# Patient Record
Sex: Male | Born: 1945
Health system: Southern US, Community
[De-identification: ages and names within clinical notes are randomized; demographics above are authoritative.]

## PROBLEM LIST (undated history)

## (undated) DIAGNOSIS — I2510 Atherosclerotic heart disease of native coronary artery without angina pectoris: Secondary | ICD-10-CM

## (undated) DIAGNOSIS — IMO0001 Reserved for inherently not codable concepts without codable children: Secondary | ICD-10-CM

## (undated) DIAGNOSIS — D649 Anemia, unspecified: Secondary | ICD-10-CM

## (undated) DIAGNOSIS — E7800 Pure hypercholesterolemia, unspecified: Secondary | ICD-10-CM

## (undated) DIAGNOSIS — K611 Rectal abscess: Secondary | ICD-10-CM

## (undated) HISTORY — PX: COLONOSCOPY: SHX174

## (undated) HISTORY — PX: CARDIAC VALVE REPLACEMENT: SHX585

## (undated) HISTORY — DX: Atherosclerotic heart disease of native coronary artery without angina pectoris: I25.10

## (undated) HISTORY — PX: NO PAST SURGERIES: SHX2092

---

## 2004-03-22 ENCOUNTER — Ambulatory Visit (HOSPITAL_COMMUNITY): Admission: RE | Admit: 2004-03-22 | Discharge: 2004-03-22 | Payer: Self-pay | Admitting: Internal Medicine

## 2011-04-18 ENCOUNTER — Telehealth (INDEPENDENT_AMBULATORY_CARE_PROVIDER_SITE_OTHER): Payer: Self-pay | Admitting: General Surgery

## 2011-04-18 NOTE — Telephone Encounter (Signed)
The patient called questioning if he could be seen today in urgent clinic for a perirectal abscess, urgent clinic is full for today, pt stated he could wait to until tomorrow afternoon to be seen. Area is painful and hard to sit.

## 2011-04-19 ENCOUNTER — Encounter (INDEPENDENT_AMBULATORY_CARE_PROVIDER_SITE_OTHER): Payer: Self-pay | Admitting: Surgery

## 2011-04-20 ENCOUNTER — Encounter (INDEPENDENT_AMBULATORY_CARE_PROVIDER_SITE_OTHER): Payer: Self-pay | Admitting: Surgery

## 2011-06-12 ENCOUNTER — Encounter (HOSPITAL_COMMUNITY): Payer: Self-pay | Admitting: *Deleted

## 2011-06-14 ENCOUNTER — Encounter (HOSPITAL_COMMUNITY): Payer: Self-pay | Admitting: *Deleted

## 2011-06-15 ENCOUNTER — Encounter (HOSPITAL_COMMUNITY): Payer: Self-pay | Admitting: *Deleted

## 2011-06-15 ENCOUNTER — Ambulatory Visit (HOSPITAL_COMMUNITY)
Admission: RE | Admit: 2011-06-15 | Discharge: 2011-06-15 | Disposition: A | Discharge status: Home / Self Care | Payer: Medicare Other | Source: Ambulatory Visit | Attending: Gastroenterology | Admitting: Gastroenterology

## 2011-06-15 ENCOUNTER — Ambulatory Visit (HOSPITAL_COMMUNITY): Payer: Medicare Other | Admitting: *Deleted

## 2011-06-15 ENCOUNTER — Encounter (HOSPITAL_COMMUNITY)
Admission: RE | Disposition: A | Discharge status: Home / Self Care | Payer: Self-pay | Source: Ambulatory Visit | Attending: Gastroenterology

## 2011-06-15 DIAGNOSIS — Z800 Family history of malignant neoplasm of digestive organs: Secondary | ICD-10-CM | POA: Insufficient documentation

## 2011-06-15 DIAGNOSIS — D371 Neoplasm of uncertain behavior of stomach: Secondary | ICD-10-CM | POA: Insufficient documentation

## 2011-06-15 DIAGNOSIS — Z1211 Encounter for screening for malignant neoplasm of colon: Secondary | ICD-10-CM | POA: Insufficient documentation

## 2011-06-15 DIAGNOSIS — Z79899 Other long term (current) drug therapy: Secondary | ICD-10-CM | POA: Insufficient documentation

## 2011-06-15 DIAGNOSIS — E7800 Pure hypercholesterolemia, unspecified: Secondary | ICD-10-CM | POA: Insufficient documentation

## 2011-06-15 HISTORY — DX: Rectal abscess: K61.1

## 2011-06-15 HISTORY — DX: Reserved for inherently not codable concepts without codable children: IMO0001

## 2011-06-15 HISTORY — DX: Pure hypercholesterolemia, unspecified: E78.00

## 2011-06-15 SURGERY — COLONOSCOPY WITH PROPOFOL
Anesthesia: Monitor Anesthesia Care

## 2011-06-15 MED ORDER — FENTANYL CITRATE 0.05 MG/ML IJ SOLN
INTRAMUSCULAR | Status: DC | PRN
  Administered 2011-06-15 (×3): 50 ug via INTRAVENOUS

## 2011-06-15 MED ORDER — MIDAZOLAM HCL 5 MG/5ML IJ SOLN
INTRAMUSCULAR | Status: DC | PRN
  Administered 2011-06-15: 1 mg via INTRAVENOUS

## 2011-06-15 MED ORDER — LACTATED RINGERS IV SOLN
INTRAVENOUS | Status: DC | PRN
  Administered 2011-06-15: 13:00:00 via INTRAVENOUS

## 2011-06-15 MED ORDER — GLYCOPYRROLATE 0.2 MG/ML IJ SOLN
INTRAMUSCULAR | Status: DC | PRN
  Administered 2011-06-15: 0.2 mg via INTRAVENOUS

## 2011-06-15 MED ORDER — PROPOFOL 10 MG/ML IV EMUL
INTRAVENOUS | Status: DC | PRN
  Administered 2011-06-15: 140 ug/kg/min via INTRAVENOUS

## 2011-06-15 MED ORDER — LIDOCAINE HCL (CARDIAC) 20 MG/ML IV SOLN
INTRAVENOUS | Status: DC | PRN
  Administered 2011-06-15: 50 mg via INTRAVENOUS

## 2011-06-15 MED ORDER — LACTATED RINGERS IV SOLN: INTRAVENOUS | Status: DC

## 2011-06-15 SURGICAL SUPPLY — 21 items

## 2011-06-15 NOTE — Anesthesia Postprocedure Evaluation (Signed)
  Anesthesia Post-op Note  Patient: Luis Robles  Procedure(s) Performed: Procedure(s) (LRB): COLONOSCOPY WITH PROPOFOL (N/A)  Patient Location: PACU  Anesthesia Type: MAC  Level of Consciousness: awake and alert   Airway and Oxygen Therapy: Patient Spontanous Breathing  Post-op Pain: mild  Post-op Assessment: Post-op Vital signs reviewed, Patient's Cardiovascular Status Stable, Respiratory Function Stable, Patent Airway and No signs of Nausea or vomiting  Post-op Vital Signs: stable  Complications: No apparent anesthesia complications

## 2011-06-15 NOTE — Anesthesia Preprocedure Evaluation (Addendum)
Anesthesia Evaluation  Patient identified by MRN, date of birth, ID band Patient awake    Reviewed: Allergy & Precautions, H&P , NPO status , Patient's Chart, lab work & pertinent test results  Airway Mallampati: II TM Distance: >3 FB Neck ROM: Full    Dental   Rough edges upper teeth.:   Pulmonary neg pulmonary ROS,  breath sounds clear to auscultation  Pulmonary exam normal       Cardiovascular negative cardio ROS  Rhythm:Regular Rate:Normal     Neuro/Psych negative neurological ROS  negative psych ROS   GI/Hepatic negative GI ROS, Neg liver ROS,   Endo/Other  negative endocrine ROS  Renal/GU negative Renal ROS  negative genitourinary   Musculoskeletal negative musculoskeletal ROS (+)   Abdominal   Peds negative pediatric ROS (+)  Hematology negative hematology ROS (+)   Anesthesia Other Findings   Reproductive/Obstetrics negative OB ROS                          Anesthesia Physical Anesthesia Plan  ASA: I  Anesthesia Plan: MAC   Post-op Pain Management:    Induction: Intravenous  Airway Management Planned:   Additional Equipment:   Intra-op Plan:   Post-operative Plan:   Informed Consent: I have reviewed the patients History and Physical, chart, labs and discussed the procedure including the risks, benefits and alternatives for the proposed anesthesia with the patient or authorized representative who has indicated his/her understanding and acceptance.   Dental advisory given  Plan Discussed with: CRNA  Anesthesia Plan Comments:         Anesthesia Quick Evaluation

## 2011-06-15 NOTE — Op Note (Signed)
Procedure: Screening colonoscopy.  Endoscopist: Danise Edge  Premedication: Propofol administered by anesthesia  Procedure: The patient was placed in the left lateral decubitus position. Anal inspection and digital rectal exam were normal. The Pentax pediatric colonoscope was introduced into the rectum and advanced to the cecum. A normal-appearing appendiceal orifice and ileocecal valve were identified. Colonic preparation for the exam today was good.  Rectum. Normal. Retroflexed view of the distal rectum was normal.  Sigmoid colon. A 5 mm sessile polyp was removed from the distal sigmoid colon with the cold snare and submitted for pathologic interpretation.  Descending colon. Normal.  Splenic flexure. Normal.  Transverse colon. Normal.  Hepatic flexure. Normal.  Ascending colon. Normal.  Cecum and ileocecal valve. Normal.  Assessment. A 5 mm sessile polyp was removed from the distal sigmoid colon with the cold snare pathologic interpretation. Otherwise normal proctocolonoscopy to the cecum.  Recommendations. Repeat screening colonoscopy in 5 years.

## 2011-06-15 NOTE — Preoperative (Signed)
Beta Blockers   Reason not to administer Beta Blockers:Not Applicable 

## 2011-06-15 NOTE — H&P (Signed)
History: The patient is a 66 year old male scheduled to undergo a screening colonoscopy with polypectomy to prevent colon cancer. He underwent a normal screening colonoscopy in 08/04/1998. His father died of colon cancer at age 67. His father's brother was diagnosed with colon cancer at age 66.  Medication allergies: None  Chronic medications: Metamucil. Vitamin C. Vitamin D. Lovaza.  Past medical history: Perirectal abscess. Thalassemia minor. Aortic valve sclerosis. Hypercholesterolemia.  Habits: The patient has never smoked cigarettes and does not consume alcohol.  Exam: The patient is alert and lying comfortably on the endoscopy stretcher. Mouth and throat are normal. Lungs are clear to auscultation. Cardiac exam reveals a regular rhythm. Abdomen is soft, flat, and nontender to palpation in all quadrants.  Plan: Proceed with screening colonoscopy is scheduled.

## 2011-06-15 NOTE — Discharge Instructions (Signed)
Colonoscopy Care After Read the instructions outlined below and refer to this sheet in the next few weeks. These discharge instructions provide you with general information on caring for yourself after you leave the hospital. Your doctor may also give you specific instructions. While your treatment has been planned according to the most current medical practices available, unavoidable complications occasionally occur. If you have any problems or questions after discharge, call your doctor. HOME CARE INSTRUCTIONS ACTIVITY:  You may resume your regular activity, but move at a slower pace for the next 24 hours.   Take frequent rest periods for the next 24 hours.   Walking will help get rid of the air and reduce the bloated feeling in your belly (abdomen).   No driving for 24 hours (because of the medicine (anesthesia) used during the test).   You may shower.   Do not sign any important legal documents or operate any machinery for 24 hours (because of the anesthesia used during the test).  NUTRITION:  Drink plenty of fluids.   You may resume your normal diet as instructed by your doctor.   Begin with a light meal and progress to your normal diet. Heavy or fried foods are harder to digest and may make you feel sick to your stomach (nauseated).   Avoid alcoholic beverages for 24 hours or as instructed.  MEDICATIONS:  You may resume your normal medications unless your doctor tells you otherwise.  WHAT TO EXPECT TODAY:  Some feelings of bloating in the abdomen.   Passage of more gas than usual.   Spotting of blood in your stool or on the toilet paper.  IF YOU HAD POLYPS REMOVED DURING THE COLONOSCOPY:  No aspirin products for 7 days or as instructed.   No alcohol for 7 days or as instructed.   Eat a soft diet for the next 24 hours.  FINDING OUT THE RESULTS OF YOUR TEST Not all test results are available during your visit. If your test results are not back during the visit, make an  appointment with your caregiver to find out the results. Do not assume everything is normal if you have not heard from your caregiver or the medical facility. It is important for you to follow up on all of your test results.  SEEK IMMEDIATE MEDICAL CARE IF:  You have more than a spotting of blood in your stool.   Your belly is swollen (abdominal distention).   You are nauseated or vomiting.   You have a fever.   You have abdominal pain or discomfort that is severe or gets worse throughout the day.  Document Released: 09/14/2003 Document Revised: 01/19/2011 Document Reviewed: 09/12/2007 Exit[employer] Patient Information 2012 [employer]. 

## 2011-06-15 NOTE — Transfer of Care (Signed)
Immediate Anesthesia Transfer of Care Note  Patient: Luis Robles  Procedure(s) Performed: Procedure(s) (LRB): COLONOSCOPY WITH PROPOFOL (N/A)  Patient Location: PACU  Anesthesia Type: MAC  Level of Consciousness: awake, alert , oriented and patient cooperative  Airway & Oxygen Therapy: Patient Spontanous Breathing and Patient connected to face mask oxygen  Post-op Assessment: Report given to PACU RN, Post -op Vital signs reviewed and stable and Patient moving all extremities  Post vital signs: Reviewed and stable  Complications: No apparent anesthesia complications

## 2013-04-24 ENCOUNTER — Emergency Department (INDEPENDENT_AMBULATORY_CARE_PROVIDER_SITE_OTHER)
Admission: EM | Admit: 2013-04-24 | Discharge: 2013-04-24 | Disposition: A | Discharge status: Home / Self Care | Payer: Self-pay | Source: Home / Self Care | Attending: Family Medicine | Admitting: Family Medicine

## 2013-04-24 ENCOUNTER — Encounter (HOSPITAL_COMMUNITY): Payer: Self-pay | Admitting: Emergency Medicine

## 2013-04-24 ENCOUNTER — Emergency Department (INDEPENDENT_AMBULATORY_CARE_PROVIDER_SITE_OTHER): Payer: Medicare Other

## 2013-04-24 DIAGNOSIS — S299XXA Unspecified injury of thorax, initial encounter: Secondary | ICD-10-CM

## 2013-04-24 DIAGNOSIS — S2341XA Sprain of ribs, initial encounter: Secondary | ICD-10-CM

## 2013-04-24 MED ORDER — DICLOFENAC SODIUM 75 MG PO TBEC: 75.0000 mg | DELAYED_RELEASE_TABLET | Freq: Two times a day (BID) | ORAL | Status: DC | PRN

## 2013-04-24 MED ORDER — CYCLOBENZAPRINE HCL 10 MG PO TABS: 10.0000 mg | ORAL_TABLET | Freq: Every evening | ORAL | Status: DC | PRN
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
## 2013-04-24 NOTE — Discharge Instructions (Signed)
Thank you for coming in today. Use flexeril at night as needed.  Take voltaren twice daily as needed.  Follow up with physical therapy.
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
## 2014-03-18 ENCOUNTER — Emergency Department (INDEPENDENT_AMBULATORY_CARE_PROVIDER_SITE_OTHER)
Admission: EM | Admit: 2014-03-18 | Discharge: 2014-03-18 | Disposition: A | Discharge status: Home / Self Care | Payer: PPO | Source: Home / Self Care | Attending: Family Medicine | Admitting: Family Medicine

## 2014-03-18 ENCOUNTER — Encounter (HOSPITAL_COMMUNITY): Payer: Self-pay | Admitting: Emergency Medicine

## 2014-03-18 DIAGNOSIS — H01002 Unspecified blepharitis right lower eyelid: Secondary | ICD-10-CM

## 2014-03-18 MED ORDER — CEPHALEXIN 500 MG PO CAPS: 500.0000 mg | ORAL_CAPSULE | Freq: Three times a day (TID) | ORAL | Status: DC

## 2014-03-18 MED ORDER — CEPHALEXIN 500 MG PO CAPS: 500.0000 mg | ORAL_CAPSULE | Freq: Four times a day (QID) | ORAL | Status: DC

## 2014-03-18 MED ORDER — ERYTHROMYCIN 5 MG/GM OP OINT: TOPICAL_OINTMENT | OPHTHALMIC | Status: DC

## 2014-03-18 NOTE — ED Notes (Signed)
Pt states that his eye has had drainage from his eye and some mild pain for the past 2 days.

## 2014-03-18 NOTE — Discharge Instructions (Signed)
Warm soak and medicine as prescribed and see eye doctor if further problems.

## 2014-03-18 NOTE — ED Provider Notes (Signed)
CSN: [id number]     Arrival date & time 03/18/14  1901 History   First MD Initiated Contact with Patient 03/18/14 1907     Chief Complaint  Patient presents with  . Eye Drainage  . Eye Pain   (Consider location/radiation/quality/duration/timing/severity/associated sxs/prior Treatment) Patient is a 69 y.o. male presenting with eye problem. The history is provided by the patient.  Eye Problem Location:  R eye Quality:  Dull Severity:  Mild Onset quality:  Gradual Duration:  2 days Progression:  Unchanged Chronicity:  New Context: not contact lens problem, not foreign body and not scratch   Ineffective treatments:  Flushing Associated symptoms: crusting and discharge   Associated symptoms: no blurred vision, no decreased vision, no itching, no photophobia and no redness   Risk factors: recent URI   Risk factors: no conjunctival hemorrhage and not exposed to pinkeye     Past Medical History  Diagnosis Date  . Perirectal abscess   . Thalassemia minor   . Aortic sclerosis   . Hypercholesteremia    History reviewed. No pertinent past surgical history. History reviewed. No pertinent family history. History  Substance Use Topics  . Smoking status: Never Smoker   . Smokeless tobacco: Not on file  . Alcohol Use: No    Review of Systems  HENT: Positive for congestion.   Eyes: Positive for discharge. Negative for blurred vision, photophobia, pain, redness, itching and visual disturbance.    Allergies  Review of patient's allergies indicates no known allergies.  Home Medications   Prior to Admission medications   Medication Sig Start Date End Date Taking? Authorizing Provider  Ascorbic Acid (VITAMIN C) 1000 MG tablet Take 1,000 mg by mouth 2 (two) times daily.   Yes Historical Provider, MD  fish oil-omega-3 fatty acids 1000 MG capsule Take 3 g by mouth daily.   Yes Historical Provider, MD  Multiple Vitamin (MULITIVITAMIN WITH MINERALS) TABS Take 1 tablet by mouth daily.   Yes  Historical Provider, MD  cephALEXin (KEFLEX) 500 MG capsule Take 1 capsule (500 mg total) by mouth 3 (three) times daily. Take all of medicine and drink lots of fluids 03/18/14   Billy Fischer, MD  cyclobenzaprine (FLEXERIL) 10 MG tablet Take 1 tablet (10 mg total) by mouth at bedtime as needed for muscle spasms. 04/24/13   Gregor Hams, MD  diclofenac (VOLTAREN) 75 MG EC tablet Take 1 tablet (75 mg total) by mouth 2 (two) times daily as needed. 04/24/13   Gregor Hams, MD  erythromycin ophthalmic ointment Apply to eyelid qid after warm cloth soak to eye. 03/18/14   Billy Fischer, MD   BP 150/78 mmHg  Pulse 66  Temp(Src) 97.8 F (36.6 C) (Oral)  Resp 16  SpO2 96% Physical Exam  Constitutional: He is oriented to person, place, and time. He appears well-developed and well-nourished. No distress.  HENT:  Head: Normocephalic.  Mouth/Throat: Oropharynx is clear and moist.  Eyes: Conjunctivae and EOM are normal. Pupils are equal, round, and reactive to light. Right eye exhibits discharge. Left eye exhibits no discharge. No scleral icterus.  Right lower lid sts, sl d/c.  Neck: Normal range of motion. Neck supple.  Lymphadenopathy:    He has no cervical adenopathy.  Neurological: He is alert and oriented to person, place, and time.  Skin: Skin is warm and dry.  Nursing note and vitals reviewed.   ED Course  Procedures (including critical care time) Labs Review Labs Reviewed - No data to display  Imaging Review No results found.   MDM     Billy Fischer, MD 03/18/14 [phone]

## 2014-08-27 ENCOUNTER — Emergency Department (HOSPITAL_COMMUNITY)
Admission: EM | Admit: 2014-08-27 | Discharge: 2014-08-27 | Disposition: A | Discharge status: Home / Self Care | Payer: PPO | Attending: Emergency Medicine | Admitting: Emergency Medicine

## 2014-08-27 ENCOUNTER — Encounter (HOSPITAL_COMMUNITY): Payer: Self-pay | Admitting: Emergency Medicine

## 2014-08-27 DIAGNOSIS — R001 Bradycardia, unspecified: Secondary | ICD-10-CM | POA: Diagnosis not present

## 2014-08-27 DIAGNOSIS — H81391 Other peripheral vertigo, right ear: Secondary | ICD-10-CM

## 2014-08-27 DIAGNOSIS — Z792 Long term (current) use of antibiotics: Secondary | ICD-10-CM | POA: Insufficient documentation

## 2014-08-27 DIAGNOSIS — Z79899 Other long term (current) drug therapy: Secondary | ICD-10-CM | POA: Diagnosis not present

## 2014-08-27 DIAGNOSIS — Z8639 Personal history of other endocrine, nutritional and metabolic disease: Secondary | ICD-10-CM | POA: Insufficient documentation

## 2014-08-27 DIAGNOSIS — Z8719 Personal history of other diseases of the digestive system: Secondary | ICD-10-CM | POA: Insufficient documentation

## 2014-08-27 DIAGNOSIS — Z8679 Personal history of other diseases of the circulatory system: Secondary | ICD-10-CM | POA: Insufficient documentation

## 2014-08-27 DIAGNOSIS — R110 Nausea: Secondary | ICD-10-CM | POA: Insufficient documentation

## 2014-08-27 DIAGNOSIS — R55 Syncope and collapse: Secondary | ICD-10-CM | POA: Diagnosis present

## 2014-08-27 LAB — I-STAT CHEM 8, ED
BUN: 20 mg/dL (ref 6–20)
CREATININE: 0.8 mg/dL (ref 0.61–1.24)
Calcium, Ion: 1.06 mmol/L — ABNORMAL LOW (ref 1.13–1.30)
Chloride: 106 mmol/L (ref 101–111)
GLUCOSE: 107 mg/dL — AB (ref 65–99)
HCT: 38 % — ABNORMAL LOW (ref 39.0–52.0)
HEMOGLOBIN: 12.9 g/dL — AB (ref 13.0–17.0)
Potassium: 3.3 mmol/L — ABNORMAL LOW (ref 3.5–5.1)
Sodium: 141 mmol/L (ref 135–145)
TCO2: 19 mmol/L (ref 0–100)

## 2014-08-27 MED ORDER — LORAZEPAM 2 MG/ML IJ SOLN
INTRAMUSCULAR | Status: AC
  Filled 2014-08-27: qty 1

## 2014-08-27 MED ORDER — MECLIZINE HCL 25 MG PO TABS: 25.0000 mg | ORAL_TABLET | Freq: Three times a day (TID) | ORAL | Status: DC | PRN

## 2014-08-27 MED ORDER — LORAZEPAM 2 MG/ML IJ SOLN
1.0000 mg | Freq: Once | INTRAMUSCULAR | Status: AC
  Administered 2014-08-27: 1 mg via INTRAVENOUS

## 2014-08-27 MED ORDER — LORAZEPAM 2 MG/ML IJ SOLN: 1.0000 mg | Freq: Once | INTRAMUSCULAR | Status: DC

## 2014-08-27 NOTE — Discharge Instructions (Signed)
Benign Positional Vertigo Vertigo means you feel like you or your surroundings are moving when they are not. Benign positional vertigo is the most common form of vertigo. Benign means that the cause of your condition is not serious. Benign positional vertigo is more common in older adults. CAUSES  Benign positional vertigo is the result of an upset in the labyrinth system. This is an area in the middle ear that helps control your balance. This may be caused by a viral infection, head injury, or repetitive motion. However, often no specific cause is found. SYMPTOMS  Symptoms of benign positional vertigo occur when you move your head or eyes in different directions. Some of the symptoms may include:  Loss of balance and falls.  Vomiting.  Blurred vision.  Dizziness.  Nausea.  Involuntary eye movements (nystagmus). DIAGNOSIS  Benign positional vertigo is usually diagnosed by physical exam. If the specific cause of your benign positional vertigo is unknown, your caregiver may perform imaging tests, such as magnetic resonance imaging (MRI) or computed tomography (CT). TREATMENT  Your caregiver may recommend movements or procedures to correct the benign positional vertigo. Medicines such as meclizine, benzodiazepines, and medicines for nausea may be used to treat your symptoms. In rare cases, if your symptoms are caused by certain conditions that affect the inner ear, you may need surgery. HOME CARE INSTRUCTIONS   Follow your caregiver's instructions.  Move slowly. Do not make sudden body or head movements.  Avoid driving.  Avoid operating heavy machinery.  Avoid performing any tasks that would be dangerous to you or others during a vertigo episode.  Drink enough fluids to keep your urine clear or pale yellow. SEEK IMMEDIATE MEDICAL CARE IF:   You develop problems with walking, weakness, numbness, or using your arms, hands, or legs.  You have difficulty speaking.  You develop  severe headaches.  Your nausea or vomiting continues or gets worse.  You develop visual changes.  Your family or friends notice any behavioral changes.  Your condition gets worse.  You have a fever.  You develop a stiff neck or sensitivity to light. MAKE SURE YOU:   Understand these instructions.  Will watch your condition.  Will get help right away if you are not doing well or get worse. Document Released: 11/07/2005 Document Revised: 04/24/2011 Document Reviewed: 10/20/2010 Exit[employer] Patient Information 2015 [employer]. This information is not intended to replace advice given to you by your health care provider. Make sure you discuss any questions you have with your health care provider.    

## 2014-08-27 NOTE — ED Notes (Signed)
Ambulated pt approximately 100 paces around POD A. Pt had no signs of dizziness, labored breathing or any other difficulties in ambulation.

## 2014-08-27 NOTE — ED Notes (Signed)
MD at bedside. 

## 2014-08-27 NOTE — ED Notes (Signed)
Family at bedside. 

## 2014-08-27 NOTE — ED Notes (Signed)
The patient's wife said he was laying down and got nauseated and almost passed out.  Denies all pain but is severely nauseated.    The patient does have projectile vomiting.  EMS gave him 4mg  of Zfran, 500 cc of NS through an 18g in his left arm.  EMS also said he has projectile vomiting and it is red due to him eating berries.  He did have SOB, N/V, dizziness and was negative for stroke screen with EMS.  Wife is at the bedside.

## 2014-08-27 NOTE — ED Provider Notes (Signed)
CSN: [id number]     Arrival date & time 08/27/14  1622 History   First MD Initiated Contact with Patient 08/27/14 1624     Chief Complaint  Patient presents with  . Near Syncope    The patient's wife said he was laying down and got nauseated and almost passed out.  Denies all pain but is severely nauseated.    The patient does have projectile vomiting.     (Consider location/radiation/quality/duration/timing/severity/associated sxs/prior Treatment) Patient is a 69 y.o. male presenting with dizziness. The history is provided by the patient, the spouse and the EMS personnel.  Dizziness Quality:  Head spinning and room spinning Severity:  Severe Onset quality:  Sudden Duration:  1 hour Timing:  Constant Progression:  Unchanged Chronicity:  New Context comment:  Patient states he was just sitting and all of a sudden started having severe spinning. He got up and walked to the bedroom and tried to lay down and close his eyes in the spinning only worsened. It started to make him nauseated, pale and sweaty Relieved by:  Nothing Worsened by:  Nothing Ineffective treatments:  None tried Associated symptoms: nausea and shortness of breath   Associated symptoms: no chest pain, no diarrhea and no headaches   Associated symptoms comment:  Diaphoresis, near syncope Risk factors: no heart disease, no hx of stroke, no multiple medications and no new medications     Past Medical History  Diagnosis Date  . Perirectal abscess   . Thalassemia minor   . Aortic sclerosis   . Hypercholesteremia    History reviewed. No pertinent past surgical history. History reviewed. No pertinent family history. History  Substance Use Topics  . Smoking status: Never Smoker   . Smokeless tobacco: Not on file  . Alcohol Use: No    Review of Systems  Respiratory: Positive for shortness of breath.   Cardiovascular: Negative for chest pain.  Gastrointestinal: Positive for nausea. Negative for diarrhea.   Neurological: Positive for dizziness. Negative for headaches.  All other systems reviewed and are negative.     Allergies  Review of patient's allergies indicates no known allergies.  Home Medications   Prior to Admission medications   Medication Sig Start Date End Date Taking? Authorizing Provider  Ascorbic Acid (VITAMIN C) 1000 MG tablet Take 1,000 mg by mouth 2 (two) times daily.    Historical Provider, MD  cephALEXin (KEFLEX) 500 MG capsule Take 1 capsule (500 mg total) by mouth 3 (three) times daily. Take all of medicine and drink lots of fluids 03/18/14   Billy Fischer, MD  cyclobenzaprine (FLEXERIL) 10 MG tablet Take 1 tablet (10 mg total) by mouth at bedtime as needed for muscle spasms. 04/24/13   Gregor Hams, MD  diclofenac (VOLTAREN) 75 MG EC tablet Take 1 tablet (75 mg total) by mouth 2 (two) times daily as needed. 04/24/13   Gregor Hams, MD  erythromycin ophthalmic ointment Apply to eyelid qid after warm cloth soak to eye. 03/18/14   Billy Fischer, MD  fish oil-omega-3 fatty acids 1000 MG capsule Take 3 g by mouth daily.    Historical Provider, MD  Multiple Vitamin (MULITIVITAMIN WITH MINERALS) TABS Take 1 tablet by mouth daily.    Historical Provider, MD   BP 121/53 mmHg  Pulse 46  Temp(Src) 97.4 F (36.3 C) (Oral)  Resp 16  Ht 5\' 8"  (1.727 m)  Wt 148 lb (67.132 kg)  BMI 22.51 kg/m2  SpO2 98% Physical Exam  Constitutional: He  is oriented to person, place, and time. He appears well-developed and well-nourished. He appears distressed.  HENT:  Head: Normocephalic and atraumatic.  Mouth/Throat: Oropharynx is clear and moist.  Eyes: Conjunctivae and EOM are normal. Pupils are equal, round, and reactive to light.  Neck: Normal range of motion. Neck supple.  Cardiovascular: Regular rhythm and intact distal pulses.  Bradycardia present.   No murmur heard. Pulmonary/Chest: Effort normal and breath sounds normal. No respiratory distress. He has no wheezes. He has no rales.   Abdominal: Soft. He exhibits no distension. There is no tenderness. There is no rebound and no guarding.  Musculoskeletal: Normal range of motion. He exhibits no edema or tenderness.  Neurological: He is alert and oriented to person, place, and time. He has normal strength. No cranial nerve deficit or sensory deficit. Coordination and gait normal.  Patient currently unable to lay on his back due to the sensation of spinning. He only way on his side.  8:16 PM Normal heel to shin bilaterally.  No visual field cuts.  Normal finger to nose bilaterally  Skin: Skin is warm. No rash noted. He is diaphoretic. No erythema. There is pallor.  Psychiatric: He has a normal mood and affect. His behavior is normal.  Nursing note and vitals reviewed.   ED Course  Procedures (including critical care time) Labs Review Labs Reviewed  I-STAT CHEM 8, ED - Abnormal; Notable for the following:    Potassium 3.3 (*)    Glucose, Bld 107 (*)    Calcium, Ion 1.06 (*)    Hemoglobin 12.9 (*)    HCT 38.0 (*)    All other components within normal limits  CBC WITH DIFFERENTIAL/PLATELET  CBC WITH DIFFERENTIAL/PLATELET  I-STAT TROPOININ, ED    Imaging Review No results found.   EKG Interpretation   Date/Time:  Thursday August 27 2014 16:38:56 EDT Ventricular Rate:  54 PR Interval:  231 QRS Duration: 95 QT Interval:  477 QTC Calculation: 452 R Axis:   81 Text Interpretation:  Sinus rhythm Prolonged PR interval Consider left  atrial enlargement Borderline right axis deviation Left ventricular  hypertrophy Nonspecific T abnrm, anterolateral leads No previous tracing  Confirmed by Maryan Rued  MD, Loree Fee ([id number]) on 08/27/2014 5:05:53 PM      MDM   Final diagnoses:  Peripheral vertigo, right    Pt with sx most consistent with peripheral vertigo.  No systemic or infectious sx.  Normal neuro exam without weakness, ataxia or cerebellar findings on exam.  Normal vision.  Sx are reproducible with movement of  the head and attempting to walk.  No hx of Stroke and low likelihood.  No risk factors and normal VS. Will treat for peripheral vertigo and re-eval.  8:14 PM On repeat exam after Ativan patient's dizziness has improved. The nausea is also improving. Patient is now able to give more the history and states that it started with worse moving across screen worse when he moved his head to the right that then developed into severe dizziness and vomiting.    Physical exam without any signs concerning for stroke.   Blanchie Dessert, MD 08/27/14 2150

## 2014-08-27 NOTE — ED Notes (Signed)
Patient is alert and orientedx4.  Patient was explained discharge instructions and they understood them with no questions.  The patient's wife, Luis Robles is taking the patient home.

## 2014-08-28 LAB — CBC WITH DIFFERENTIAL/PLATELET
Basophils Absolute: 0.1 10*3/uL (ref 0.0–0.1)
Basophils Relative: 1 % (ref 0–1)
EOS ABS: 0.1 10*3/uL (ref 0.0–0.7)
EOS PCT: 2 % (ref 0–5)
HCT: 35.1 % — ABNORMAL LOW (ref 39.0–52.0)
HEMOGLOBIN: 11.5 g/dL — AB (ref 13.0–17.0)
Lymphocytes Relative: 47 % — ABNORMAL HIGH (ref 12–46)
Lymphs Abs: 2.7 10*3/uL (ref 0.7–4.0)
MCH: 23.3 pg — ABNORMAL LOW (ref 26.0–34.0)
MCHC: 32.8 g/dL (ref 30.0–36.0)
MCV: 71.2 fL — AB (ref 78.0–100.0)
MONOS PCT: 6 % (ref 3–12)
Monocytes Absolute: 0.3 10*3/uL (ref 0.1–1.0)
Neutro Abs: 2.6 10*3/uL (ref 1.7–7.7)
Neutrophils Relative %: 44 % (ref 43–77)
Platelets: 207 10*3/uL (ref 150–400)
RBC: 4.93 MIL/uL (ref 4.22–5.81)
RDW: 14.7 % (ref 11.5–15.5)
WBC: 5.8 10*3/uL (ref 4.0–10.5)

## 2014-08-28 LAB — I-STAT TROPONIN, ED: TROPONIN I, POC: 0 ng/mL (ref 0.00–0.08)

## 2015-07-09 DIAGNOSIS — Z0000 Encounter for general adult medical examination without abnormal findings: Secondary | ICD-10-CM | POA: Diagnosis not present

## 2015-07-09 DIAGNOSIS — E7800 Pure hypercholesterolemia, unspecified: Secondary | ICD-10-CM | POA: Diagnosis not present

## 2015-07-09 DIAGNOSIS — Z7189 Other specified counseling: Secondary | ICD-10-CM | POA: Diagnosis not present

## 2015-07-09 DIAGNOSIS — Z800 Family history of malignant neoplasm of digestive organs: Secondary | ICD-10-CM | POA: Diagnosis not present

## 2015-07-09 DIAGNOSIS — Z1389 Encounter for screening for other disorder: Secondary | ICD-10-CM | POA: Diagnosis not present

## 2015-07-09 DIAGNOSIS — I358 Other nonrheumatic aortic valve disorders: Secondary | ICD-10-CM | POA: Diagnosis not present

## 2016-08-01 DIAGNOSIS — Z1211 Encounter for screening for malignant neoplasm of colon: Secondary | ICD-10-CM | POA: Diagnosis not present

## 2016-08-01 DIAGNOSIS — Z0000 Encounter for general adult medical examination without abnormal findings: Secondary | ICD-10-CM | POA: Diagnosis not present

## 2016-08-01 DIAGNOSIS — Z125 Encounter for screening for malignant neoplasm of prostate: Secondary | ICD-10-CM | POA: Diagnosis not present

## 2016-08-01 DIAGNOSIS — Z1389 Encounter for screening for other disorder: Secondary | ICD-10-CM | POA: Diagnosis not present

## 2016-08-01 DIAGNOSIS — Z800 Family history of malignant neoplasm of digestive organs: Secondary | ICD-10-CM | POA: Diagnosis not present

## 2017-08-06 DIAGNOSIS — Z0000 Encounter for general adult medical examination without abnormal findings: Secondary | ICD-10-CM | POA: Diagnosis not present

## 2017-08-06 DIAGNOSIS — Z1389 Encounter for screening for other disorder: Secondary | ICD-10-CM | POA: Diagnosis not present

## 2017-08-06 DIAGNOSIS — Z800 Family history of malignant neoplasm of digestive organs: Secondary | ICD-10-CM | POA: Diagnosis not present

## 2017-08-06 DIAGNOSIS — E7800 Pure hypercholesterolemia, unspecified: Secondary | ICD-10-CM | POA: Diagnosis not present

## 2017-11-16 ENCOUNTER — Other Ambulatory Visit: Payer: Self-pay | Admitting: Surgery

## 2017-11-16 DIAGNOSIS — E042 Nontoxic multinodular goiter: Secondary | ICD-10-CM

## 2017-11-16 DIAGNOSIS — E039 Hypothyroidism, unspecified: Secondary | ICD-10-CM

## 2018-03-01 ENCOUNTER — Ambulatory Visit (HOSPITAL_COMMUNITY)
Admission: EM | Admit: 2018-03-01 | Discharge: 2018-03-01 | Disposition: A | Discharge status: Home / Self Care | Payer: PPO | Attending: Emergency Medicine | Admitting: Emergency Medicine

## 2018-03-01 ENCOUNTER — Ambulatory Visit (INDEPENDENT_AMBULATORY_CARE_PROVIDER_SITE_OTHER): Payer: PPO

## 2018-03-01 ENCOUNTER — Other Ambulatory Visit: Payer: Self-pay

## 2018-03-01 ENCOUNTER — Encounter (HOSPITAL_COMMUNITY): Payer: Self-pay | Admitting: Emergency Medicine

## 2018-03-01 DIAGNOSIS — J22 Unspecified acute lower respiratory infection: Secondary | ICD-10-CM | POA: Diagnosis not present

## 2018-03-01 DIAGNOSIS — R05 Cough: Secondary | ICD-10-CM

## 2018-03-01 MED ORDER — BENZONATATE 100 MG PO CAPS: 100.0000 mg | ORAL_CAPSULE | Freq: Three times a day (TID) | ORAL | 0 refills | Status: DC

## 2018-03-01 MED ORDER — AMOXICILLIN-POT CLAVULANATE 875-125 MG PO TABS: 1.0000 | ORAL_TABLET | Freq: Two times a day (BID) | ORAL | 0 refills | Status: AC

## 2018-03-01 MED ORDER — AZITHROMYCIN 250 MG PO TABS: 250.0000 mg | ORAL_TABLET | Freq: Every day | ORAL | 0 refills | Status: DC

## 2018-03-01 NOTE — ED Triage Notes (Signed)
PT reports fever, cold, cough for a week.

## 2018-03-01 NOTE — Discharge Instructions (Signed)
X-rays did not show pneumonia or bronchitis, however based on symptoms we will treat you for potential lower respiratory tract infection.   Get plenty of rest and push fluids Augmentin and azithromycin prescribed.  Take antibiotics as directed and to completion Prescribed tessolone perles as needed for cough Use OTC medication as needed for symptomatic relief Follow up with PCP next week for recheck and/or if symptoms persists Return or go to ER if you have any new or worsening symptoms such as fever, chills, fatigue, shortness of breath, wheezing, chest pain, nausea, changes in bowel or bladder habits, etc..Marland Kitchen

## 2018-03-01 NOTE — ED Provider Notes (Signed)
[facility]   [id number] 03/01/18 Arrival Time: 0801  Cc: COUGH  SUBJECTIVE:  Luis Robles is a 73 y.o. male who presents with productive cough and fever x 1 week.  Tmax of 102 at home.  Denies positive sick exposure or precipitating event.  Describes cough as intermittent and dry/ productive with green/yellow sputum.  Has tried OTC medications with relief of fever.  Symptoms are made worse at night.  Denies previous symptoms in the past.  Complains of associated fatigue, ear pain, sore throat, and wheezing.  Denies sinus pain, rhinorrhea, SOB, chest pain, nausea, changes in bowel or bladder habits.    ROS: As per HPI.  Past Medical History:  Diagnosis Date  . Aortic sclerosis   . Hypercholesteremia   . Perirectal abscess   . Thalassemia minor    History reviewed. No pertinent surgical history. No Known Allergies No current facility-administered medications on file prior to encounter.    Current Outpatient Medications on File Prior to Encounter  Medication Sig Dispense Refill  . cyanocobalamin 500 MCG tablet Take 500 mcg by mouth every other day.    . erythromycin ophthalmic ointment Apply to eyelid qid after warm cloth soak to eye. 3.5 g 0  . Multiple Vitamin (MULITIVITAMIN WITH MINERALS) TABS Take 1 tablet by mouth daily.    . Omega-3 Fatty Acids (FISH OIL) 1000 MG CAPS Take 1,000 mg by mouth 3 (three) times daily.      Social History   Socioeconomic History  . Marital status: Married    Spouse name: Not on file  . Number of children: Not on file  . Years of education: Not on file  . Highest education level: Not on file  Occupational History  . Not on file  Social Needs  . Financial resource strain: Not on file  . Food insecurity:    Worry: Not on file    Inability: Not on file  . Transportation needs:    Medical: Not on file    Non-medical: Not on file  Tobacco Use  . Smoking status: Never Smoker  Substance and Sexual Activity  . Alcohol use: No    . Drug use: No  . Sexual activity: Not on file  Lifestyle  . Physical activity:    Days per week: Not on file    Minutes per session: Not on file  . Stress: Not on file  Relationships  . Social connections:    Talks on phone: Not on file    Gets together: Not on file    Attends religious service: Not on file    Active member of club or organization: Not on file    Attends meetings of clubs or organizations: Not on file    Relationship status: Not on file  . Intimate partner violence:    Fear of current or ex partner: Not on file    Emotionally abused: Not on file    Physically abused: Not on file    Forced sexual activity: Not on file  Other Topics Concern  . Not on file  Social History Narrative  . Not on file   No family history on file.   OBJECTIVE:  Vitals:   03/01/18 0820  BP: 124/69  Pulse: 60  Resp: 16  Temp: 98.6 F (37 C)  TempSrc: Oral  SpO2: 97%     General appearance: Alert, appears fatigued, but nontoxic; speaking in full sentences without difficulty HEENT:NCAT; Ears: EACs clear, TMs pearly gray; Eyes: PERRL.  EOM grossly intact. Nose: nares patent without rhinorrhea; Throat: tonsils nonerythematous or enlarged, uvula midline  Neck: supple without LAD Lungs: Rhonchi heard throughout bilateral lungs on anterior and posterior chest Heart: Systolic murmur Skin: warm and dry Psychological: alert and cooperative; normal mood and affect  DIAGNOSTIC STUDIES:  Dg Chest 2 View  Result Date: 03/01/2018 CLINICAL DATA:  Cough, fever, body aches and congestion. EXAM: CHEST - 2 VIEW COMPARISON:  04/25/1998 FINDINGS: Stable top-normal heart size. Lungs show mild interstitial prominence without focal airspace consolidation, edema or pleural fluid. Stable calcified granulomata in the right lower lung. Stable associated calcified hilar lymph nodes consistent with granulomatous disease. No pneumothorax. Bony structures are unremarkable. IMPRESSION: Mild pulmonary  interstitial prominence and evidence of prior granulomatous disease. No acute findings. Electronically Signed   By: Aletta Edouard M.D.   On: 03/01/2018 08:46     ASSESSMENT & PLAN:  1. Lower respiratory infection (e.g., bronchitis, pneumonia, pneumonitis, pulmonitis)     Meds ordered this encounter  Medications  . azithromycin (ZITHROMAX) 250 MG tablet    Sig: Take 1 tablet (250 mg total) by mouth daily. Take first 2 tablets together, then 1 every day until finished.    Dispense:  6 tablet    Refill:  0    Order Specific Question:   Supervising Provider    Answer:   Raylene Everts [[id number]]  . amoxicillin-clavulanate (AUGMENTIN) 875-125 MG tablet    Sig: Take 1 tablet by mouth every 12 (twelve) hours for 10 days.    Dispense:  20 tablet    Refill:  0    Order Specific Question:   Supervising Provider    Answer:   Raylene Everts [[id number]]  . benzonatate (TESSALON) 100 MG capsule    Sig: Take 1 capsule (100 mg total) by mouth every 8 (eight) hours.    Dispense:  21 capsule    Refill:  0    Order Specific Question:   Supervising Provider    Answer:   Raylene Everts [[id number]]    Orders Placed This Encounter  Procedures  . DG Chest 2 View    Standing Status:   Standing    Number of Occurrences:   1    Order Specific Question:   Reason for Exam (SYMPTOM  OR DIAGNOSIS REQUIRED)    Answer:   cough and feverx 1 week    X-rays did not show pneumonia or bronchitis, however based on symptoms we will treat you for potential lower respiratory tract infection.   Get plenty of rest and push fluids Augmentin and azithromycin prescribed.  Take antibiotics as directed and to completion Prescribed tessolone perles as needed for cough Use OTC medication as needed for symptomatic relief Follow up with PCP next week for recheck and/or if symptoms persists Return or go to ER if you have any new or worsening symptoms such as fever, chills, fatigue, shortness of breath, wheezing,  chest pain, nausea, changes in bowel or bladder habits, etc...  Reviewed expectations re: course of current medical issues. Questions answered. Outlined signs and symptoms indicating need for more acute intervention. Patient verbalized understanding. After Visit Summary given.          Lestine Box, PA-C 03/01/18 [phone]

## 2019-04-28 ENCOUNTER — Ambulatory Visit: Payer: PPO | Attending: Family

## 2019-04-28 DIAGNOSIS — Z20822 Contact with and (suspected) exposure to covid-19: Secondary | ICD-10-CM

## 2019-09-08 DIAGNOSIS — Z1389 Encounter for screening for other disorder: Secondary | ICD-10-CM | POA: Diagnosis not present

## 2019-09-08 DIAGNOSIS — Z1159 Encounter for screening for other viral diseases: Secondary | ICD-10-CM | POA: Diagnosis not present

## 2019-09-08 DIAGNOSIS — Z5181 Encounter for therapeutic drug level monitoring: Secondary | ICD-10-CM | POA: Diagnosis not present

## 2019-09-08 DIAGNOSIS — Z125 Encounter for screening for malignant neoplasm of prostate: Secondary | ICD-10-CM | POA: Diagnosis not present

## 2019-09-08 DIAGNOSIS — Z800 Family history of malignant neoplasm of digestive organs: Secondary | ICD-10-CM | POA: Diagnosis not present

## 2019-09-08 DIAGNOSIS — E7800 Pure hypercholesterolemia, unspecified: Secondary | ICD-10-CM | POA: Diagnosis not present

## 2019-09-08 DIAGNOSIS — Z0000 Encounter for general adult medical examination without abnormal findings: Secondary | ICD-10-CM | POA: Diagnosis not present

## 2019-11-17 DIAGNOSIS — H2513 Age-related nuclear cataract, bilateral: Secondary | ICD-10-CM | POA: Diagnosis not present

## 2019-11-17 DIAGNOSIS — H5203 Hypermetropia, bilateral: Secondary | ICD-10-CM | POA: Diagnosis not present

## 2019-11-18 DIAGNOSIS — Z1159 Encounter for screening for other viral diseases: Secondary | ICD-10-CM | POA: Diagnosis not present

## 2019-12-07 NOTE — Progress Notes (Signed)
Cardiology Office Note:   Date:  12/09/2019  NAME:  Luis Robles    MRN: [id number] DOB:  May 27, 1945   PCP:  Marius Ditch, MD  Cardiologist:  No primary care provider on file.   Referring MD: Arta Silence, MD   Chief Complaint  Patient presents with  . Pre-op Exam   History of Present Illness:   Luis Robles is a 74 y.o. male with a hx of HLD who is being seen today for the evaluation of preoperative assessment at the request of Arta Silence, MD.  He reports he was going to have a colonoscopy recently.  Apparently he had sinus bradycardia and anesthesia would not perform the procedure.  His EKG today demonstrates sinus bradycardia with a heart rate 43.  He has a first-degree AV block up to 240 ms.  He is a retired Architectural technologist.  He reports he has no significant medical problems.  Blood pressure is 118/62.  I did review his lipid profile which is detailed below.  He exercises twice daily.  He reports he does cardio in the mornings up to 30 minutes without any chest pain or shortness of breath.  He also does weight training.  He also reports in the evenings he will swim up to 30 minutes without any symptoms of chest pain or shortness of breath.  He reports his heart rate will go up appropriately up into the 120 range and then go back down into the 60s.  Again he reports no symptoms.  There is no strong family history of heart disease.  He is a never smoker.  He does not drink alcohol or use drugs.  He is never had any heart procedures.  He is interested in calcium scoring.  We did go over the fact that this is a good test for him.  We can follow-up those results with him by phone.  Total cholesterol 234, HDL 72, LDL 153.  Past Medical History: Past Medical History:  Diagnosis Date  . Aortic sclerosis   . Hypercholesteremia   . Perirectal abscess   . Thalassemia minor     Past Surgical History: History reviewed. No pertinent surgical history.  Current  Medications: Current Meds  Medication Sig  . cyanocobalamin 500 MCG tablet Take 500 mcg by mouth every other day.  Marland Kitchen METFORMIN HCL PO Take 1,500 mg by mouth daily.  . Multiple Vitamin (MULITIVITAMIN WITH MINERALS) TABS Take 1 tablet by mouth daily.  . Omega-3 Fatty Acids (FISH OIL) 1000 MG CAPS Take 1,000 mg by mouth 3 (three) times daily.     Allergies:    Patient has no known allergies.   Social History: Social History   Socioeconomic History  . Marital status: Married    Spouse name: Not on file  . Number of children: Not on file  . Years of education: Not on file  . Highest education level: Not on file  Occupational History  . Occupation: retired Education officer, community  Tobacco Use  . Smoking status: Never Smoker  . Smokeless tobacco: Never Used  Substance and Sexual Activity  . Alcohol use: No  . Drug use: No  . Sexual activity: Not on file  Other Topics Concern  . Not on file  Social History Narrative  . Not on file   Social Determinants of Health   Financial Resource Strain:   . Difficulty of Paying Living Expenses: Not on file  Food Insecurity:   . Worried About Charity fundraiser  in the Last Year: Not on file  . Ran Out of Food in the Last Year: Not on file  Transportation Needs:   . Lack of Transportation (Medical): Not on file  . Lack of Transportation (Non-Medical): Not on file  Physical Activity:   . Days of Exercise per Week: Not on file  . Minutes of Exercise per Session: Not on file  Stress:   . Feeling of Stress : Not on file  Social Connections:   . Frequency of Communication with Friends and Family: Not on file  . Frequency of Social Gatherings with Friends and Family: Not on file  . Attends Religious Services: Not on file  . Active Member of Clubs or Organizations: Not on file  . Attends Archivist Meetings: Not on file  . Marital Status: Not on file     Family History: The patient's family history includes Colon cancer in his  father.  ROS:   All other ROS reviewed and negative. Pertinent positives noted in the HPI.     EKGs/Labs/Other Studies Reviewed:   The following studies were personally reviewed by me today:  EKG:  EKG is ordered today.  The ekg ordered today demonstrates sinus bradycardia, heart rate 43, first-degree AV block 240 ms, no acute ischemic changes, no evidence of prior infarction, and was personally reviewed by me.   Recent Labs: No results found for requested labs within last 8760 hours.   Recent Lipid Panel No results found for: CHOL, TRIG, HDL, CHOLHDL, VLDL, LDLCALC, LDLDIRECT  Physical Exam:   VS:  BP 118/62   Pulse (!) 43   Ht 5\' 8"  (1.727 m)   Wt 152 lb 12.8 oz (69.3 kg)   BMI 23.23 kg/m    Wt Readings from Last 3 Encounters:  12/09/19 152 lb 12.8 oz (69.3 kg)  08/27/14 148 lb (67.1 kg)  06/15/11 160 lb (72.6 kg)    General: Well nourished, well developed, in no acute distress Heart: Atraumatic, normal size  Eyes: PEERLA, EOMI  Neck: Supple, no JVD Endocrine: No thryomegaly Cardiac: Normal S1, S2; RRR; faint 1 out of 6 holosystolic murmur Lungs: Clear to auscultation bilaterally, no wheezing, rhonchi or rales  Abd: Soft, nontender, no hepatomegaly  Ext: No edema, pulses 2+ Musculoskeletal: No deformities, BUE and BLE strength normal and equal Skin: Warm and dry, no rashes   Neuro: Alert and oriented to person, place, time, and situation, CNII-XII grossly intact, no focal deficits  Psych: Normal mood and affect   ASSESSMENT:   Luis Robles is a 74 y.o. male who presents for the following: 1. Preoperative cardiovascular examination   2. Bradycardia   3. Other hyperlipidemia     PLAN:   1. Preoperative cardiovascular examination 2. Bradycardia -EKG demonstrates sinus bradycardia.  He also has a first-degree block.  He has a narrow QRS.  He has no symptoms.  Cardiovascular exam is benign.  He can exercise greater than 4 METS and well beyond this without any  symptoms of chest pain, shortness of breath or palpitations.  He has no lightheadedness or dizziness.  He may proceed without further cardiovascular testing as his EKG is benign.  He has no significant conduction disease to be worried about.  I think it would be okay for him to proceed at the outpatient gastroenterology center for colonoscopy. -He does have a faint 1 out of 6 holosystolic murmur.  This is not need evaluation.  Has had this for years.  3. Other hyperlipidemia -He  does have a mildly elevated LDL cholesterol.  He is interested in calcium scoring we will set him up for this.  Disposition: Return if symptoms worsen or fail to improve.  Medication Adjustments/Labs and Tests Ordered: Current medicines are reviewed at length with the patient today.  Concerns regarding medicines are outlined above.  Orders Placed This Encounter  Procedures  . CT CARDIAC SCORING  . EKG 12-Lead   No orders of the defined types were placed in this encounter.   Patient Instructions  Medication Instructions:  No changes   Lab Work: None Ordered   Testing/Procedures: Coronary Calcium Score   Follow-Up: At [facility], you and your health needs are our priority.  As part of our continuing mission to provide you with exceptional heart care, we have created designated Provider Care Teams.  These Care Teams include your primary Cardiologist (physician) and Advanced Practice Providers (APPs -  Physician Assistants and Nurse Practitioners) who all work together to provide you with the care you need, when you need it.  We recommend signing up for the patient portal called "MyChart".  Sign up information is provided on this After Visit Summary.  MyChart is used to connect with patients for Virtual Visits (Telemedicine).  Patients are able to view lab/test results, encounter notes, upcoming appointments, etc.  Non-urgent messages can be sent to your provider as well.   To learn more about what you can  do with MyChart, go to [email or web address].    Your next appointment:  As Needed   The format for your next appointment: Office   Provider:  Dr.O'Neal       Signed, Addison Naegeli. Audie Box, [place]  [address] [place], [place] [address] [phone]  12/09/2019 9:24 AM

## 2019-12-09 ENCOUNTER — Encounter: Payer: Self-pay | Admitting: Cardiovascular Disease

## 2019-12-09 ENCOUNTER — Ambulatory Visit: Payer: PPO | Admitting: Cardiovascular Disease

## 2019-12-09 ENCOUNTER — Other Ambulatory Visit: Payer: Self-pay

## 2019-12-09 VITALS — BP 118/62 | HR 43 | Ht 68.0 in | Wt 152.8 lb

## 2019-12-09 DIAGNOSIS — Z01810 Encounter for preprocedural cardiovascular examination: Secondary | ICD-10-CM

## 2019-12-09 DIAGNOSIS — E7849 Other hyperlipidemia: Secondary | ICD-10-CM | POA: Diagnosis not present

## 2019-12-09 DIAGNOSIS — R001 Bradycardia, unspecified: Secondary | ICD-10-CM

## 2019-12-09 NOTE — Patient Instructions (Signed)
Medication Instructions:  No changes   Lab Work: None Ordered   Testing/Procedures: Coronary Calcium Score   Follow-Up: At [facility], you and your health needs are our priority.  As part of our continuing mission to provide you with exceptional heart care, we have created designated Provider Care Teams.  These Care Teams include your primary Cardiologist (physician) and Advanced Practice Providers (APPs -  Physician Assistants and Nurse Practitioners) who all work together to provide you with the care you need, when you need it.  We recommend signing up for the patient portal called "MyChart".  Sign up information is provided on this After Visit Summary.  MyChart is used to connect with patients for Virtual Visits (Telemedicine).  Patients are able to view lab/test results, encounter notes, upcoming appointments, etc.  Non-urgent messages can be sent to your provider as well.   To learn more about what you can do with MyChart, go to [email or web address].    Your next appointment:  As Needed   The format for your next appointment: Office   Provider:  Dr.O'Neal

## 2019-12-11 ENCOUNTER — Other Ambulatory Visit: Payer: Self-pay

## 2019-12-11 ENCOUNTER — Ambulatory Visit (INDEPENDENT_AMBULATORY_CARE_PROVIDER_SITE_OTHER)
Admission: RE | Admit: 2019-12-11 | Discharge: 2019-12-11 | Disposition: A | Discharge status: Home / Self Care | Payer: Self-pay | Source: Ambulatory Visit | Attending: Cardiovascular Disease | Admitting: Cardiovascular Disease

## 2019-12-11 DIAGNOSIS — R001 Bradycardia, unspecified: Secondary | ICD-10-CM

## 2019-12-11 DIAGNOSIS — Z01810 Encounter for preprocedural cardiovascular examination: Secondary | ICD-10-CM

## 2019-12-11 DIAGNOSIS — E7849 Other hyperlipidemia: Secondary | ICD-10-CM

## 2019-12-12 DIAGNOSIS — E785 Hyperlipidemia, unspecified: Secondary | ICD-10-CM

## 2019-12-12 MED ORDER — ROSUVASTATIN CALCIUM 20 MG PO TABS: 20.0000 mg | ORAL_TABLET | Freq: Every day | ORAL | 3 refills | Status: DC

## 2019-12-15 ENCOUNTER — Inpatient Hospital Stay: Admission: RE | Admit: 2019-12-15 | Payer: PPO | Source: Ambulatory Visit

## 2019-12-18 ENCOUNTER — Telehealth: Payer: Self-pay | Admitting: Cardiovascular Disease

## 2019-12-18 NOTE — Telephone Encounter (Signed)
Left message for patient. Need to know what results he is coming by to pick up.

## 2019-12-18 NOTE — Addendum Note (Signed)
Addended by: Patria Mane A on: 12/18/2019 02:05 PM   Modules accepted: Orders

## 2019-12-18 NOTE — Telephone Encounter (Signed)
New message:    Patient states that the nurse told him it is ok to come pick up his results. Patient will by around 11 am to pick up results.

## 2019-12-23 NOTE — Telephone Encounter (Signed)
Called to ask if pt had successfully picked up whatever results he required. Left message to call our office if there was anything we still needed to do to assist him.

## 2020-02-16 DIAGNOSIS — E785 Hyperlipidemia, unspecified: Secondary | ICD-10-CM | POA: Diagnosis not present

## 2020-02-17 LAB — LIPID PANEL
Chol/HDL Ratio: 2 ratio (ref 0.0–5.0)
Cholesterol, Total: 140 mg/dL (ref 100–199)
HDL: 71 mg/dL (ref >39)
LDL Chol Calc (NIH): 55 mg/dL (ref 0–99)
Triglycerides: 67 mg/dL (ref 0–149)
VLDL Cholesterol Cal: 14 mg/dL (ref 5–40)

## 2020-02-17 LAB — APO A1 + B + RATIO
Apolipo. B/A-1 Ratio: 0.4 ratio (ref 0.0–0.7)
Apolipoprotein A-1: 158 mg/dL (ref 101–178)
Apolipoprotein B: 58 mg/dL (ref <90)

## 2020-02-18 NOTE — Progress Notes (Signed)
Cardiology Office Note:   Date:  02/19/2020  NAME:  Luis Robles    MRN: [id number] DOB:  01-10-1946   PCP:  Marius Ditch, MD  Cardiologist:  No primary care provider on file.   Referring MD: Marius Ditch, MD   Chief Complaint  Patient presents with  . Follow-up    2 months.   History of Present Illness:   Luis Robles is a 75 y.o. male with a hx of HLD, CAD who presents for follow-up. Seen last year for preop assessment for sinus bradycardia. High level of activity. CAC score 676 and started on statin. LDL now at goal. Reports he is tolerating Crestor well.  No significant myalgias.  He is still maintaining a high level of activity.  He does cardiovascular activity 30 minutes in the morning and lift weights.  He also walks up to 30 minutes at night.  No chest pain or shortness of breath.  LDL cholesterol 55.  Blood pressure 98/52.  Heart rate in the 50s.  We discussed heart healthy activity which he is already doing.  We also discussed proper diet which he is already doing.  He is well ahead of where he needs to be from a cardiovascular prevention standpoint.   Problem List 1. CAD  -CAC score 676 (72nd percentile) 2. HLD -T chol 140, HDL 71, LDL 55, TG 67  Past Medical History: Past Medical History:  Diagnosis Date  . Aortic sclerosis   . Hypercholesteremia   . Perirectal abscess   . Thalassemia minor     Past Surgical History: History reviewed. No pertinent surgical history.  Current Medications: Current Meds  Medication Sig  . cyanocobalamin 500 MCG tablet Take 500 mcg by mouth every other day.  Marland Kitchen METFORMIN HCL PO Take 1,500 mg by mouth daily.  . Multiple Vitamin (MULITIVITAMIN WITH MINERALS) TABS Take 1 tablet by mouth daily.  . Omega-3 Fatty Acids (FISH OIL) 1000 MG CAPS Take 1,000 mg by mouth 3 (three) times daily.  . rosuvastatin (CRESTOR) 20 MG tablet Take 1 tablet (20 mg total) by mouth daily.     Allergies:    Patient has no known allergies.    Social History: Social History   Socioeconomic History  . Marital status: Married    Spouse name: Not on file  . Number of children: Not on file  . Years of education: Not on file  . Highest education level: Not on file  Occupational History  . Occupation: retired Education officer, community  Tobacco Use  . Smoking status: Never Smoker  . Smokeless tobacco: Never Used  Substance and Sexual Activity  . Alcohol use: No  . Drug use: No  . Sexual activity: Not on file  Other Topics Concern  . Not on file  Social History Narrative  . Not on file   Social Determinants of Health   Financial Resource Strain: Not on file  Food Insecurity: Not on file  Transportation Needs: Not on file  Physical Activity: Not on file  Stress: Not on file  Social Connections: Not on file     Family History: The patient's family history includes Colon cancer in his father.  ROS:   All other ROS reviewed and negative. Pertinent positives noted in the HPI.     EKGs/Labs/Other Studies Reviewed:   The following studies were personally reviewed by me today:   CT Calcium 12/11/2019 Coronary calcium score of 676. This was 72nd percentile for age and sex matched controls.  Recent Labs: No results found for requested labs within last 8760 hours.   Recent Lipid Panel    Component Value Date/Time   CHOL 140 02/16/2020 1616   TRIG 67 02/16/2020 1616   HDL 71 02/16/2020 1616   CHOLHDL 2.0 02/16/2020 1616   LDLCALC 55 02/16/2020 1616    Physical Exam:   VS:  BP (!) 98/52 (BP Location: Left Arm, Patient Position: Sitting, Cuff Size: Normal)   Pulse (!) 52   Ht 5\' 8"  (1.727 m)   Wt 149 lb (67.6 kg)   BMI 22.66 kg/m    Wt Readings from Last 3 Encounters:  02/19/20 149 lb (67.6 kg)  12/09/19 152 lb 12.8 oz (69.3 kg)  08/27/14 148 lb (67.1 kg)    General: Well nourished, well developed, in no acute distress Head: Atraumatic, normal size  Eyes: PEERLA, EOMI  Neck: Supple, no JVD Endocrine: No  thryomegaly Cardiac: Normal S1, S2; RRR; no murmurs, rubs, or gallops Lungs: Clear to auscultation bilaterally, no wheezing, rhonchi or rales  Abd: Soft, nontender, no hepatomegaly  Ext: No edema, pulses 2+ Musculoskeletal: No deformities, BUE and BLE strength normal and equal Skin: Warm and dry, no rashes   Neuro: Alert and oriented to person, place, time, and situation, CNII-XII grossly intact, no focal deficits  Psych: Normal mood and affect   ASSESSMENT:   Luis Robles is a 75 y.o. male who presents for the following: 1. Coronary artery disease involving native coronary artery of native heart without angina pectoris   2. Agatston coronary artery calcium score greater than 400   3. Hyperlipidemia, unspecified hyperlipidemia type     PLAN:   1. Coronary artery disease involving native coronary artery of native heart without angina pectoris 2. Agatston coronary artery calcium score greater than 400 3. Hyperlipidemia, unspecified hyperlipidemia type -CAC score 676 (72nd percentile) -Recent calcium scoring with elevated score.  No chest pain or shortness of breath.  He is maintaining a very high level of activity without symptoms.  We did discuss possibly pursuing a stress test given his calcium score over 400.  However, given his high level activity I see this is unnecessary.  His cardiovascular exam is normal and he is without symptoms.  Mainstay of treatment will be controlling his cholesterol.  He will continue Lipitor 20 mg a day.  Most recent LDL 55.  I will see him yearly.  He can also consider an aspirin, but crestor is most beneficial.  Disposition: Return in about 1 year (around 02/18/2021).  Medication Adjustments/Labs and Tests Ordered: Current medicines are reviewed at length with the patient today.  Concerns regarding medicines are outlined above.  No orders of the defined types were placed in this encounter.  No orders of the defined types were placed in this  encounter.   Patient Instructions  Medication Instructions:  Your physician recommends that you continue on your current medications as directed. Please refer to the Current Medication list given to you today.  *If you need a refill on your cardiac medications before your next appointment, please call your pharmacy*  Lab Work: NONE ordered at this time of appointment   If you have labs (blood work) drawn today and your tests are completely normal, you will receive your results only by: Marland Kitchen MyChart Message (if you have MyChart) OR . A paper copy in the mail If you have any lab test that is abnormal or we need to change your treatment, we will call you to review  the results.  Testing/Procedures: NONE ordered at this time of appointment   Follow-Up: At [facility], you and your health needs are our priority.  As part of our continuing mission to provide you with exceptional heart care, we have created designated Provider Care Teams.  These Care Teams include your primary Cardiologist (physician) and Advanced Practice Providers (APPs -  Physician Assistants and Nurse Practitioners) who all work together to provide you with the care you need, when you need it.  Your next appointment:   12 month(s)  The format for your next appointment:   In Person  Provider:   Lennie Odor, MD  Other Instructions      Time Spent with Patient: I have spent a total of 25 minutes with patient reviewing hospital notes, telemetry, EKGs, labs and examining the patient as well as establishing an assessment and plan that was discussed with the patient.  > 50% of time was spent in direct patient care.  Signed, Lenna Gilford. Flora Lipps, MD [facility]  [address], [address] [place], [place] [address] [phone]  02/19/2020 10:34 AM

## 2020-02-19 ENCOUNTER — Other Ambulatory Visit: Payer: Self-pay

## 2020-02-19 ENCOUNTER — Encounter: Payer: Self-pay | Admitting: Cardiovascular Disease

## 2020-02-19 ENCOUNTER — Ambulatory Visit (INDEPENDENT_AMBULATORY_CARE_PROVIDER_SITE_OTHER): Payer: PPO | Admitting: Cardiovascular Disease

## 2020-02-19 VITALS — BP 98/52 | HR 52 | Ht 68.0 in | Wt 149.0 lb

## 2020-02-19 DIAGNOSIS — R931 Abnormal findings on diagnostic imaging of heart and coronary circulation: Secondary | ICD-10-CM

## 2020-02-19 DIAGNOSIS — E785 Hyperlipidemia, unspecified: Secondary | ICD-10-CM

## 2020-02-19 DIAGNOSIS — I2510 Atherosclerotic heart disease of native coronary artery without angina pectoris: Secondary | ICD-10-CM | POA: Diagnosis not present

## 2020-02-19 NOTE — Patient Instructions (Signed)
Medication Instructions:  Your physician recommends that you continue on your current medications as directed. Please refer to the Current Medication list given to you today.  *If you need a refill on your cardiac medications before your next appointment, please call your pharmacy*  Lab Work: NONE ordered at this time of appointment   If you have labs (blood work) drawn today and your tests are completely normal, you will receive your results only by: Marland Kitchen MyChart Message (if you have MyChart) OR . A paper copy in the mail If you have any lab test that is abnormal or we need to change your treatment, we will call you to review the results.  Testing/Procedures: NONE ordered at this time of appointment   Follow-Up: At [facility], you and your health needs are our priority.  As part of our continuing mission to provide you with exceptional heart care, we have created designated Provider Care Teams.  These Care Teams include your primary Cardiologist (physician) and Advanced Practice Providers (APPs -  Physician Assistants and Nurse Practitioners) who all work together to provide you with the care you need, when you need it.  Your next appointment:   12 month(s)  The format for your next appointment:   In Person  Provider:   Lennie Odor, MD  Other Instructions

## 2020-02-20 MED ORDER — EZETIMIBE 10 MG PO TABS: 10.0000 mg | ORAL_TABLET | Freq: Every day | ORAL | 3 refills | Status: DC

## 2020-02-20 NOTE — Addendum Note (Signed)
Addended by: Fidel Levy on: 02/20/2020 04:30 PM   Modules accepted: Orders

## 2020-03-11 DIAGNOSIS — Z01812 Encounter for preprocedural laboratory examination: Secondary | ICD-10-CM | POA: Diagnosis not present

## 2020-03-16 DIAGNOSIS — D123 Benign neoplasm of transverse colon: Secondary | ICD-10-CM | POA: Diagnosis not present

## 2020-03-16 DIAGNOSIS — Z800 Family history of malignant neoplasm of digestive organs: Secondary | ICD-10-CM | POA: Diagnosis not present

## 2020-03-18 DIAGNOSIS — D123 Benign neoplasm of transverse colon: Secondary | ICD-10-CM | POA: Diagnosis not present

## 2020-10-08 ENCOUNTER — Ambulatory Visit (INDEPENDENT_AMBULATORY_CARE_PROVIDER_SITE_OTHER): Payer: PPO

## 2020-10-08 ENCOUNTER — Ambulatory Visit (HOSPITAL_COMMUNITY)
Admission: EM | Admit: 2020-10-08 | Discharge: 2020-10-08 | Disposition: A | Discharge status: Home / Self Care | Payer: PPO | Attending: Family Medicine | Admitting: Family Medicine

## 2020-10-08 ENCOUNTER — Other Ambulatory Visit: Payer: Self-pay

## 2020-10-08 ENCOUNTER — Encounter (HOSPITAL_COMMUNITY): Payer: Self-pay

## 2020-10-08 DIAGNOSIS — R0781 Pleurodynia: Secondary | ICD-10-CM

## 2020-10-08 NOTE — ED Triage Notes (Signed)
Pt c/o right sided rib cage pain, pt states he does not know why he is having the pain. Started: 10 days ago

## 2020-10-10 NOTE — ED Provider Notes (Signed)
[place]    CSN: [id number] Arrival date & time: 10/08/20  1356      History   Chief Complaint Chief Complaint  Patient presents with   Rib care Pain    HPI Luis Robles is a 75 y.o. male.   Patient presenting today with 10-day history of right-sided anterior rib soreness that he rates as a 2-4 out of 10.  He states that comes and goes but is on more than off.  Does not know of anything that necessarily makes it significantly worse, though sometimes certain positions and movements make it worse.  He denies swelling, bruising to the area, skin changes of any kind, injury to the area or recent falls, chest pain, shortness of breath, abdominal pain, nausea vomiting or diarrhea.  Has been using heat to the area which does seem to ease it off.  Has never had an issue like this in the past.  Has known coronary artery disease, followed closely by cardiology and placed on a statin this past year.   Past Medical History:  Diagnosis Date   Aortic sclerosis    Coronary artery disease    Hypercholesteremia    Perirectal abscess    Thalassemia minor    There are no problems to display for this patient.  History reviewed. No pertinent surgical history.   Home Medications    Prior to Admission medications   Medication Sig Start Date End Date Taking? Authorizing Provider  cyanocobalamin 500 MCG tablet Take 500 mcg by mouth every other day.    [provider]  ezetimibe (ZETIA) 10 MG tablet Take 1 tablet (10 mg total) by mouth daily. 02/20/20 05/20/20  Geralynn Rile, MD  METFORMIN HCL PO Take 1,500 mg by mouth daily.    [provider]  Multiple Vitamin (MULITIVITAMIN WITH MINERALS) TABS Take 1 tablet by mouth daily.    [provider]  Omega-3 Fatty Acids (FISH OIL) 1000 MG CAPS Take 1,000 mg by mouth 3 (three) times daily.    [provider]  rosuvastatin (CRESTOR) 20 MG tablet Take 1 tablet (20 mg total) by mouth daily. 12/12/19  03/11/20  O'NealCassie Freer, MD    Family History Family History  Problem Relation Age of Onset   Colon cancer Father     Social History Social History   Tobacco Use   Smoking status: Never   Smokeless tobacco: Never  Substance Use Topics   Alcohol use: No   Drug use: No     Allergies   Patient has no known allergies.   Review of Systems Review of Systems Per HPI  Physical Exam Triage Vital Signs ED Triage Vitals  Enc Vitals Group     BP 10/08/20 1456 (!) 140/55     Pulse Rate 10/08/20 1456 (!) 52     Resp 10/08/20 1456 16     Temp 10/08/20 1456 98.1 F (36.7 C)     Temp Source 10/08/20 1456 Oral     SpO2 10/08/20 1456 100 %     Weight --      Height --      Head Circumference --      Peak Flow --      Pain Score 10/08/20 1500 4     Pain Loc --      Pain Edu? --      Excl. in [place]? --    No data found.  Updated Vital Signs BP (!) 140/55 (BP Location: Left Arm)  Pulse (!) 52   Temp 98.1 F (36.7 C) (Oral)   Resp 16   SpO2 100%   Visual Acuity Right Eye Distance:   Left Eye Distance:   Bilateral Distance:    Right Eye Near:   Left Eye Near:    Bilateral Near:     Physical Exam Vitals and nursing note reviewed.  Constitutional:      Appearance: Normal appearance.  HENT:     Head: Atraumatic.     Mouth/Throat:     Mouth: Mucous membranes are moist.     Pharynx: Oropharynx is clear.  Eyes:     Extraocular Movements: Extraocular movements intact.     Conjunctiva/sclera: Conjunctivae normal.  Cardiovascular:     Rate and Rhythm: Normal rate and regular rhythm.     Heart sounds: Normal heart sounds.  Pulmonary:     Effort: Pulmonary effort is normal.     Breath sounds: Normal breath sounds. No wheezing or rales.  Abdominal:     General: Bowel sounds are normal. There is no distension.     Palpations: Abdomen is soft.     Tenderness: There is no abdominal tenderness. There is no guarding.  Musculoskeletal:        General: Tenderness  present. No swelling, deformity or signs of injury. Normal range of motion.     Cervical back: Normal range of motion and neck supple.     Comments: Very subtle tenderness to palpation left lower anterior rib region somewhat extending laterally toward flank.  No bony abnormality palpable, range of motion full and intact  Skin:    General: Skin is warm and dry.     Findings: No bruising, erythema or rash.  Neurological:     General: No focal deficit present.     Mental Status: He is oriented to person, place, and time.     Sensory: No sensory deficit.     Motor: No weakness.     Gait: Gait normal.  Psychiatric:        Mood and Affect: Mood normal.        Thought Content: Thought content normal.        Judgment: Judgment normal.     UC Treatments / Results  Labs (all labs ordered are listed, but only abnormal results are displayed) Labs Reviewed - No data to display  EKG   Radiology DG Ribs Unilateral W/Chest Right  Result Date: 10/08/2020 CLINICAL DATA:  Right anterior rib pain EXAM: RIGHT RIBS AND CHEST - 3+ VIEW COMPARISON:  03/01/2018 FINDINGS: Single-view chest demonstrates stable small calcified granuloma. No acute airspace disease, pleural effusion or pneumothorax. Borderline to mild cardiomegaly. Calcified hilar nodes. Right rib series demonstrates no acute displaced right rib fracture IMPRESSION: Negative. Electronically Signed   By: Donavan Foil M.D.   On: 10/08/2020 16:12    Procedures Procedures (including critical care time)  Medications Ordered in UC Medications - No data to display  Initial Impression / Assessment and Plan / UC Course  I have reviewed the triage vital signs and the nursing notes.  Pertinent labs & imaging results that were available during my care of the patient were reviewed by me and considered in my medical decision making (see chart for details).     Vital signs overall benign and at his baseline.  Exam very reassuring with no  significant abnormalities noted on thorough exam.  Chest and right rib x-ray negative for acute abnormalities.  Declines EKG as both parties have very  low suspicion for cardiac etiology.  Suspect rib strain, discussed close PCP follow-up for recheck within the next week.  He declines any prescription medications and will continue heat, massage, rest, muscle rubs.  Return for acutely worsening symptoms at any time.  Final Clinical Impressions(s) / UC Diagnoses   Final diagnoses:  Rib pain on right side   Discharge Instructions   None    ED Prescriptions   None    PDMP not reviewed this encounter.   Volney American, [place] 10/10/20 1008

## 2020-11-26 ENCOUNTER — Other Ambulatory Visit: Payer: Self-pay | Admitting: Cardiovascular Disease

## 2020-12-07 DIAGNOSIS — S43016A Anterior dislocation of unspecified humerus, initial encounter: Secondary | ICD-10-CM | POA: Diagnosis not present

## 2020-12-07 DIAGNOSIS — M25511 Pain in right shoulder: Secondary | ICD-10-CM | POA: Diagnosis not present

## 2020-12-29 DIAGNOSIS — S43084D Other dislocation of right shoulder joint, subsequent encounter: Secondary | ICD-10-CM | POA: Diagnosis not present

## 2021-01-03 DIAGNOSIS — S43084D Other dislocation of right shoulder joint, subsequent encounter: Secondary | ICD-10-CM | POA: Diagnosis not present

## 2021-01-04 DIAGNOSIS — S43084D Other dislocation of right shoulder joint, subsequent encounter: Secondary | ICD-10-CM | POA: Diagnosis not present

## 2021-01-11 DIAGNOSIS — S43084D Other dislocation of right shoulder joint, subsequent encounter: Secondary | ICD-10-CM | POA: Diagnosis not present

## 2021-01-13 DIAGNOSIS — S43084D Other dislocation of right shoulder joint, subsequent encounter: Secondary | ICD-10-CM | POA: Diagnosis not present

## 2021-01-18 DIAGNOSIS — S43084D Other dislocation of right shoulder joint, subsequent encounter: Secondary | ICD-10-CM | POA: Diagnosis not present

## 2021-01-25 DIAGNOSIS — S43084D Other dislocation of right shoulder joint, subsequent encounter: Secondary | ICD-10-CM | POA: Diagnosis not present

## 2021-02-04 DIAGNOSIS — M25511 Pain in right shoulder: Secondary | ICD-10-CM | POA: Diagnosis not present

## 2021-02-06 ENCOUNTER — Other Ambulatory Visit: Payer: Self-pay | Admitting: Cardiovascular Disease

## 2021-02-17 DIAGNOSIS — M25512 Pain in left shoulder: Secondary | ICD-10-CM | POA: Diagnosis not present

## 2021-02-20 ENCOUNTER — Other Ambulatory Visit: Payer: Self-pay | Admitting: Cardiovascular Disease

## 2021-02-21 ENCOUNTER — Other Ambulatory Visit: Payer: Self-pay | Admitting: Orthopedic Surgery

## 2021-02-21 DIAGNOSIS — M25511 Pain in right shoulder: Secondary | ICD-10-CM | POA: Diagnosis not present

## 2021-02-21 DIAGNOSIS — Z9889 Other specified postprocedural states: Secondary | ICD-10-CM | POA: Diagnosis not present

## 2021-02-23 ENCOUNTER — Encounter (HOSPITAL_COMMUNITY): Payer: Self-pay | Admitting: Orthopedic Surgery

## 2021-02-23 NOTE — Progress Notes (Signed)
DUE TO COVID-19 ONLY ONE VISITOR IS ALLOWED TO COME WITH YOU AND STAY IN THE WAITING ROOM ONLY DURING PRE OP AND PROCEDURE DAY OF SURGERY.   PCP - Dr Lavone Orn Cardiologist - Dr Lake Bells O'Neal  Chest x-ray - DOS (2V) EKG - DOS Stress Test - n/a ECHO - n/a Cardiac Cath - n/a  ICD Pacemaker/Loop - n/a  Sleep Study -  n/a CPAP - none  Do not take metformin on the morning of surgery. Patient takes metformin for "aging".  Buys metformin over the counter from [place].  ERAS: Clear liquids til 11:30 AM DOS.  Anesthesia review: Yes.  Patient requests Dr Tobias Alexander for his anesthesia during surgery.  The OR was notified of this request by Ebony Hail, [place] (written on board).  STOP now taking any Aspirin (unless otherwise instructed by your surgeon), Aleve, Naproxen, Ibuprofen, Motrin, Advil, Goody's, BC's, all herbal medications, fish oil, and all vitamins.   Coronavirus Screening Covid test n/a Ambulatory Surgery  Do you have any of the following symptoms:  Cough yes/no: No Fever (>100.14F)  yes/no: No Runny nose yes/no: No Sore throat yes/no: No Difficulty breathing/shortness of breath  yes/no: No  Have you traveled in the last 14 days and where? yes/no: No  Patient verbalized understanding of instructions that were given via phone.

## 2021-02-23 NOTE — Anesthesia Preprocedure Evaluation (Addendum)
Anesthesia Evaluation  Patient identified by MRN, date of birth, ID band Patient awake    Reviewed: Allergy & Precautions, NPO status , Patient's Chart, lab work & pertinent test results  History of Anesthesia Complications Negative for: history of anesthetic complications  Airway Mallampati: II  TM Distance: >3 FB Neck ROM: Full    Dental  (+) Dental Advisory Given   Pulmonary neg pulmonary ROS,    Pulmonary exam normal        Cardiovascular negative cardio ROS   Rhythm:Regular Rate:Bradycardia     Neuro/Psych negative neurological ROS  negative psych ROS   GI/Hepatic negative GI ROS, Neg liver ROS,   Endo/Other  negative endocrine ROS  Renal/GU negative Renal ROS  negative genitourinary   Musculoskeletal negative musculoskeletal ROS (+)   Abdominal   Peds negative pediatric ROS (+)  Hematology negative hematology ROS (+)   Anesthesia Other Findings   Reproductive/Obstetrics negative OB ROS                            Anesthesia Physical Anesthesia Plan  ASA: 2  Anesthesia Plan: General   Post-op Pain Management: Regional block, Tylenol PO (pre-op) and Celebrex PO (pre-op)   Induction: Intravenous  PONV Risk Score and Plan: 2 and Ondansetron and Dexamethasone  Airway Management Planned: Oral ETT  Additional Equipment:   Intra-op Plan:   Post-operative Plan: Extubation in OR  Informed Consent: I have reviewed the patients History and Physical, chart, labs and discussed the procedure including the risks, benefits and alternatives for the proposed anesthesia with the patient or authorized representative who has indicated his/her understanding and acceptance.     Dental advisory given  Plan Discussed with: Anesthesiologist, CRNA and Surgeon  Anesthesia Plan Comments: (PAT note written 02/23/2021 by Myra Gianotti, PA-C. )      Anesthesia Quick Evaluation

## 2021-02-23 NOTE — Progress Notes (Signed)
Anesthesia Chart Review: Luis Robles  Case: [id number] Date/Time: 02/24/21 1415   Procedure: REVERSE SHOULDER ARTHROPLASTY (Right: Shoulder)   Anesthesia type: Choice   Pre-op diagnosis: RIGHT SHOULDER ROTATOR CUFF TEAR ARTHROPATHY   Location: [place] OR ROOM 04 / [place] OR   Surgeons: Tania Ade, MD       DISCUSSION: Patient is a 76 year old male scheduled for the above procedure.   History includes never smoker, CAD (coronary artery calcium score > 400 11/2019), aortic sclerosis (12/11/19 CT), bradycardia, hypercholesterolemia, thalassemia minor. He is a retired Immunologist.  He was referred to cardiologist Luis Robles in 11/2019 after anesthesia cancelled colonoscopy due to bradycardia. At that time, he was exercising twice a day including cardio in the mornings, weight training, and swimming 30 minutes in the evening without CT symptoms. HR reportedly ~ 120 range with exercise and ~ 60 at baseline. EKG showed SB at 43 bpm with first degree AV block. He was cleared for colonoscopy. Of note, also noted a faint 1/6 holosystolic murmur that Luis Robles felt did not need evaluation at that time. Luis Robles did express interest in having CT Coronary Calcium Scoring which was done on 12/11/19 and showed aortic atherosclerosis and a coronary calcium score of 676 (72nd percentile for age and sex matched controls). Continue statin therapy recommended. Because Luis Robles was very active without CV symptoms, no ischemic testing recommended at that time. One year follow-up recommended at 02/19/20 office visit and is scheduled for 03/10/21.  He reported that he continues to be very active including exercising every day without CV symptoms. He denied diabetes and reported taking metformin for anti-aging properties.   Anesthesia team to evaluate on the day of surgery. He requests anesthesiologist Luis Robles if available.     VS: Ht 5\' 8"  (1.727 m)    Wt 67.6 kg    BMI 22.66 kg/m  BP Readings from Last 3 Encounters:   10/08/20 (!) 140/55  02/19/20 (!) 98/52  12/09/19 118/62   Pulse Readings from Last 3 Encounters:  10/08/20 (!) 52  02/19/20 (!) 52  12/09/19 (!) 43      PROVIDERS: Pcp, No Luis Chiquito, MD is cardiologist. He is scheduled for yearly follow-up on 03/10/21.    LABS: For day of surgery.   IMAGES: MRI Right shoulder 02/04/21 ([facility]/PACS): MPRESSION: 1. Findings compatible with sequela of prior anterior glenohumeral joint dislocation. 2. Small Hill-Sachs impaction deformity of the humeral head. Bone marrow edema within the anteroinferior glenoid without definite fracture. Overlying anteroinferior labral injury. 3. Complete full-thickness tear of the distal supraspinatus tendon with retraction to the level of the humeral head apex. Tear involves a portion of the infraspinatus tendon. 4. Advanced tendinosis with high-grade tearing of the subscapularis tendon. 5. Medial dislocation of the long head biceps tendon out of the bicipital groove. 6. Mild acromioclavicular and glenohumeral joint osteoarthritis.  Xray right ribs and chest 10/08/20: FINDINGS: - Single-view chest demonstrates stable small calcified granuloma. No acute airspace disease, pleural effusion or pneumothorax. Borderline to mild cardiomegaly. Calcified hilar nodes. - Right rib series demonstrates no acute displaced right rib fracture IMPRESSION: Negative.   CT Chest 12/11/19: FINDINGS: Aortic atherosclerosis. Multiple densely calcified mediastinal and right hilar lymph nodes are incidentally noted. Several calcified granulomas in the right lower lobe. Within the visualized portions of the thorax there are no suspicious appearing pulmonary nodules or masses, there is no acute consolidative airspace disease, no pleural effusions, no pneumothorax and no lymphadenopathy. Visualized portions of the upper  abdomen are unremarkable. There are no aggressive appearing lytic or blastic lesions noted in  the visualized portions of the skeleton. IMPRESSION: 1.  Aortic Atherosclerosis (ICD10-I70.0). 2. Old granulomatous disease, as above.   EKG: Last EKG > 1 year. 12/09/19: SB at 43 bpm, first degree AV block, minimal voltage criteria for LVH.   CV: CT Cardiac Coronary Artery Calcium Score 12/11/19: IMPRESSION: Coronary calcium score of 676. This was 72nd percentile for age and sex matched controls.   Past Medical History:  Diagnosis Date   Anemia    hgb 11 in 2016   Aortic sclerosis    Coronary artery disease    Hypercholesteremia    Perirectal abscess     Past Surgical History:  Procedure Laterality Date   COLONOSCOPY     NO PAST SURGERIES      MEDICATIONS: No current facility-administered medications for this encounter.    cyanocobalamin 500 MCG tablet   ezetimibe (ZETIA) 10 MG tablet   metFORMIN (GLUCOPHAGE) 850 MG tablet   Omega-3 Fatty Acids (FISH OIL) 1000 MG CAPS   rosuvastatin (CRESTOR) 20 MG tablet    Myra Gianotti, PA-C Surgical Short Stay/Anesthesiology [facility] Phone [phone] [facility] Phone [phone] 02/23/2021 3:55 PM

## 2021-02-24 ENCOUNTER — Encounter (HOSPITAL_COMMUNITY)
Admission: RE | Disposition: A | Discharge status: Home / Self Care | Payer: Self-pay | Source: Home / Self Care | Attending: Orthopedic Surgery

## 2021-02-24 ENCOUNTER — Ambulatory Visit (HOSPITAL_COMMUNITY): Payer: PPO | Admitting: Vascular Surgery

## 2021-02-24 ENCOUNTER — Encounter (HOSPITAL_COMMUNITY): Payer: Self-pay | Admitting: Orthopedic Surgery

## 2021-02-24 ENCOUNTER — Ambulatory Visit (HOSPITAL_COMMUNITY): Payer: PPO

## 2021-02-24 ENCOUNTER — Ambulatory Visit (HOSPITAL_COMMUNITY)
Admission: RE | Admit: 2021-02-24 | Discharge: 2021-02-24 | Disposition: A | Discharge status: Home / Self Care | Payer: PPO | Attending: Orthopedic Surgery | Admitting: Orthopedic Surgery

## 2021-02-24 ENCOUNTER — Other Ambulatory Visit: Payer: Self-pay

## 2021-02-24 DIAGNOSIS — Z7984 Long term (current) use of oral hypoglycemic drugs: Secondary | ICD-10-CM | POA: Insufficient documentation

## 2021-02-24 DIAGNOSIS — M25711 Osteophyte, right shoulder: Secondary | ICD-10-CM | POA: Insufficient documentation

## 2021-02-24 DIAGNOSIS — G8918 Other acute postprocedural pain: Secondary | ICD-10-CM | POA: Diagnosis not present

## 2021-02-24 DIAGNOSIS — W19XXXA Unspecified fall, initial encounter: Secondary | ICD-10-CM | POA: Insufficient documentation

## 2021-02-24 DIAGNOSIS — I700 Atherosclerosis of aorta: Secondary | ICD-10-CM | POA: Insufficient documentation

## 2021-02-24 DIAGNOSIS — D71 Functional disorders of polymorphonuclear neutrophils: Secondary | ICD-10-CM | POA: Diagnosis not present

## 2021-02-24 DIAGNOSIS — S43004A Unspecified dislocation of right shoulder joint, initial encounter: Secondary | ICD-10-CM | POA: Insufficient documentation

## 2021-02-24 DIAGNOSIS — R001 Bradycardia, unspecified: Secondary | ICD-10-CM | POA: Diagnosis not present

## 2021-02-24 DIAGNOSIS — Z96611 Presence of right artificial shoulder joint: Secondary | ICD-10-CM

## 2021-02-24 DIAGNOSIS — Z01818 Encounter for other preprocedural examination: Secondary | ICD-10-CM | POA: Diagnosis not present

## 2021-02-24 DIAGNOSIS — J9811 Atelectasis: Secondary | ICD-10-CM | POA: Diagnosis not present

## 2021-02-24 DIAGNOSIS — S46011A Strain of muscle(s) and tendon(s) of the rotator cuff of right shoulder, initial encounter: Secondary | ICD-10-CM | POA: Diagnosis not present

## 2021-02-24 DIAGNOSIS — Z01811 Encounter for preprocedural respiratory examination: Secondary | ICD-10-CM

## 2021-02-24 DIAGNOSIS — I2510 Atherosclerotic heart disease of native coronary artery without angina pectoris: Secondary | ICD-10-CM | POA: Diagnosis not present

## 2021-02-24 DIAGNOSIS — J8410 Pulmonary fibrosis, unspecified: Secondary | ICD-10-CM | POA: Diagnosis not present

## 2021-02-24 DIAGNOSIS — Z471 Aftercare following joint replacement surgery: Secondary | ICD-10-CM | POA: Diagnosis not present

## 2021-02-24 DIAGNOSIS — D563 Thalassemia minor: Secondary | ICD-10-CM | POA: Diagnosis not present

## 2021-02-24 HISTORY — DX: Anemia, unspecified: D64.9

## 2021-02-24 HISTORY — PX: REVERSE SHOULDER ARTHROPLASTY: SHX5054

## 2021-02-24 LAB — CBC
HCT: 43.7 % (ref 39.0–52.0)
Hemoglobin: 13.5 g/dL (ref 13.0–17.0)
MCH: 22.9 pg — ABNORMAL LOW (ref 26.0–34.0)
MCHC: 30.9 g/dL (ref 30.0–36.0)
MCV: 74.1 fL — ABNORMAL LOW (ref 80.0–100.0)
Platelets: 254 10*3/uL (ref 150–400)
RBC: 5.9 MIL/uL — ABNORMAL HIGH (ref 4.22–5.81)
RDW: 14.8 % (ref 11.5–15.5)
WBC: 6.4 10*3/uL (ref 4.0–10.5)
nRBC: 0 % (ref 0.0–0.2)

## 2021-02-24 LAB — BASIC METABOLIC PANEL
Anion gap: 11 (ref 5–15)
BUN: 15 mg/dL (ref 8–23)
CO2: 25 mmol/L (ref 22–32)
Calcium: 9.7 mg/dL (ref 8.9–10.3)
Chloride: 103 mmol/L (ref 98–111)
Creatinine, Ser: 0.72 mg/dL (ref 0.61–1.24)
GFR, Estimated: >60 mL/min (ref >60)
Glucose, Bld: 94 mg/dL (ref 70–99)
Potassium: 5.9 mmol/L — ABNORMAL HIGH (ref 3.5–5.1)
Sodium: 139 mmol/L (ref 135–145)

## 2021-02-24 LAB — TYPE AND SCREEN
ABO/RH(D): O POS
Antibody Screen: NEGATIVE

## 2021-02-24 LAB — ABO/RH: ABO/RH(D): O POS

## 2021-02-24 SURGERY — ARTHROPLASTY, SHOULDER, TOTAL, REVERSE
Anesthesia: General | Site: Shoulder | Laterality: Right

## 2021-02-24 MED ORDER — SUGAMMADEX SODIUM 200 MG/2ML IV SOLN
INTRAVENOUS | Status: DC | PRN
Start: 2021-02-24 — End: 2021-02-24
  Administered 2021-02-24: 200 mg via INTRAVENOUS

## 2021-02-24 MED ORDER — METHOCARBAMOL 500 MG PO TABS: 500.0000 mg | ORAL_TABLET | Freq: Three times a day (TID) | ORAL | 0 refills | Status: DC | PRN

## 2021-02-24 MED ORDER — LIDOCAINE 2% (20 MG/ML) 5 ML SYRINGE: INTRAMUSCULAR | Status: DC | PRN

## 2021-02-24 MED ORDER — METHOCARBAMOL 1000 MG/10ML IJ SOLN: 500.0000 mg | Freq: Four times a day (QID) | INTRAVENOUS | Status: DC | PRN

## 2021-02-24 MED ORDER — PROPOFOL 10 MG/ML IV BOLUS
INTRAVENOUS | Status: AC
  Filled 2021-02-24: qty 20

## 2021-02-24 MED ORDER — ACETAMINOPHEN 500 MG PO TABS: 1000.0000 mg | ORAL_TABLET | Freq: Four times a day (QID) | ORAL | Status: DC

## 2021-02-24 MED ORDER — OXYCODONE HCL 5 MG PO TABS: 5.0000 mg | ORAL_TABLET | ORAL | Status: DC | PRN

## 2021-02-24 MED ORDER — METOCLOPRAMIDE HCL 5 MG/ML IJ SOLN: 5.0000 mg | Freq: Three times a day (TID) | INTRAMUSCULAR | Status: DC | PRN

## 2021-02-24 MED ORDER — BUPIVACAINE HCL (PF) 0.5 % IJ SOLN
INTRAMUSCULAR | Status: DC | PRN
Start: 2021-02-24 — End: 2021-02-24
  Administered 2021-02-24: 15 mL

## 2021-02-24 MED ORDER — ONDANSETRON HCL 4 MG/2ML IJ SOLN
INTRAMUSCULAR | Status: AC
  Filled 2021-02-24: qty 2

## 2021-02-24 MED ORDER — DEXAMETHASONE SODIUM PHOSPHATE 10 MG/ML IJ SOLN
INTRAMUSCULAR | Status: DC | PRN
  Administered 2021-02-24: 10 mg via INTRAVENOUS

## 2021-02-24 MED ORDER — FENTANYL CITRATE (PF) 250 MCG/5ML IJ SOLN
INTRAMUSCULAR | Status: AC
  Filled 2021-02-24: qty 5

## 2021-02-24 MED ORDER — GLYCOPYRROLATE 0.2 MG/ML IJ SOLN
INTRAMUSCULAR | Status: DC | PRN
  Administered 2021-02-24: .1 mg via INTRAVENOUS

## 2021-02-24 MED ORDER — MIDAZOLAM HCL 2 MG/2ML IJ SOLN
INTRAMUSCULAR | Status: AC
  Administered 2021-02-24: 1 mg via INTRAVENOUS
  Filled 2021-02-24: qty 2

## 2021-02-24 MED ORDER — FENTANYL CITRATE (PF) 100 MCG/2ML IJ SOLN
INTRAMUSCULAR | Status: AC
  Administered 2021-02-24: 50 ug via INTRAVENOUS
  Filled 2021-02-24: qty 2

## 2021-02-24 MED ORDER — ROCURONIUM BROMIDE 10 MG/ML (PF) SYRINGE
PREFILLED_SYRINGE | INTRAVENOUS | Status: AC
  Filled 2021-02-24: qty 10

## 2021-02-24 MED ORDER — LIDOCAINE 2% (20 MG/ML) 5 ML SYRINGE
INTRAMUSCULAR | Status: AC
  Filled 2021-02-24: qty 5

## 2021-02-24 MED ORDER — SODIUM CHLORIDE 0.9 % IR SOLN
Status: DC | PRN
  Administered 2021-02-24: 1000 mL

## 2021-02-24 MED ORDER — FENTANYL CITRATE (PF) 250 MCG/5ML IJ SOLN
INTRAMUSCULAR | Status: DC | PRN
  Administered 2021-02-24: 50 ug via INTRAVENOUS

## 2021-02-24 MED ORDER — AMISULPRIDE (ANTIEMETIC) 5 MG/2ML IV SOLN: 10.0000 mg | Freq: Once | INTRAVENOUS | Status: DC | PRN

## 2021-02-24 MED ORDER — METOCLOPRAMIDE HCL 5 MG PO TABS: 5.0000 mg | ORAL_TABLET | Freq: Three times a day (TID) | ORAL | Status: DC | PRN

## 2021-02-24 MED ORDER — LIDOCAINE 2% (20 MG/ML) 5 ML SYRINGE
INTRAMUSCULAR | Status: DC | PRN
  Administered 2021-02-24: 40 mg via INTRAVENOUS

## 2021-02-24 MED ORDER — OXYCODONE HCL 5 MG PO TABS: 10.0000 mg | ORAL_TABLET | ORAL | Status: DC | PRN

## 2021-02-24 MED ORDER — ONDANSETRON HCL 4 MG/2ML IJ SOLN: 4.0000 mg | Freq: Four times a day (QID) | INTRAMUSCULAR | Status: DC | PRN

## 2021-02-24 MED ORDER — BUPIVACAINE LIPOSOME 1.3 % IJ SUSP
INTRAMUSCULAR | Status: DC | PRN
Start: 2021-02-24 — End: 2021-02-24
  Administered 2021-02-24: 10 mL via PERINEURAL

## 2021-02-24 MED ORDER — FENTANYL CITRATE (PF) 100 MCG/2ML IJ SOLN: 25.0000 ug | INTRAMUSCULAR | Status: DC | PRN

## 2021-02-24 MED ORDER — METHOCARBAMOL 500 MG PO TABS: 500.0000 mg | ORAL_TABLET | Freq: Four times a day (QID) | ORAL | Status: DC | PRN

## 2021-02-24 MED ORDER — ACETAMINOPHEN 325 MG PO TABS: 325.0000 mg | ORAL_TABLET | Freq: Four times a day (QID) | ORAL | Status: DC | PRN

## 2021-02-24 MED ORDER — ONDANSETRON HCL 4 MG PO TABS: 4.0000 mg | ORAL_TABLET | Freq: Four times a day (QID) | ORAL | Status: DC | PRN

## 2021-02-24 MED ORDER — 0.9 % SODIUM CHLORIDE (POUR BTL) OPTIME
TOPICAL | Status: DC | PRN
Start: 2021-02-24 — End: 2021-02-24
  Administered 2021-02-24: 1000 mL

## 2021-02-24 MED ORDER — PROMETHAZINE HCL 25 MG/ML IJ SOLN: 6.2500 mg | INTRAMUSCULAR | Status: DC | PRN

## 2021-02-24 MED ORDER — OXYCODONE HCL 5 MG PO TABS: 5.0000 mg | ORAL_TABLET | Freq: Four times a day (QID) | ORAL | 0 refills | Status: DC | PRN

## 2021-02-24 MED ORDER — MIDAZOLAM HCL 2 MG/2ML IJ SOLN: 1.0000 mg | Freq: Once | INTRAMUSCULAR | Status: AC

## 2021-02-24 MED ORDER — ROCURONIUM BROMIDE 10 MG/ML (PF) SYRINGE
PREFILLED_SYRINGE | INTRAVENOUS | Status: DC | PRN
  Administered 2021-02-24: 80 mg via INTRAVENOUS

## 2021-02-24 MED ORDER — FENTANYL CITRATE (PF) 100 MCG/2ML IJ SOLN: 50.0000 ug | Freq: Once | INTRAMUSCULAR | Status: AC

## 2021-02-24 MED ORDER — PROPOFOL 10 MG/ML IV BOLUS
INTRAVENOUS | Status: DC | PRN
  Administered 2021-02-24: 150 mg via INTRAVENOUS

## 2021-02-24 MED ORDER — DEXAMETHASONE SODIUM PHOSPHATE 10 MG/ML IJ SOLN
INTRAMUSCULAR | Status: AC
  Filled 2021-02-24: qty 1

## 2021-02-24 MED ORDER — PHENYLEPHRINE HCL-NACL 20-0.9 MG/250ML-% IV SOLN
INTRAVENOUS | Status: DC | PRN
  Administered 2021-02-24: 25 ug/min via INTRAVENOUS

## 2021-02-24 MED ORDER — EPHEDRINE SULFATE-NACL 50-0.9 MG/10ML-% IV SOSY
PREFILLED_SYRINGE | INTRAVENOUS | Status: DC | PRN
  Administered 2021-02-24 (×2): 5 mg via INTRAVENOUS

## 2021-02-24 MED ORDER — LACTATED RINGERS IV SOLN: INTRAVENOUS | Status: DC

## 2021-02-24 MED ORDER — ACETAMINOPHEN 500 MG PO TABS
1000.0000 mg | ORAL_TABLET | Freq: Once | ORAL | Status: AC
  Administered 2021-02-24: 1000 mg via ORAL
  Filled 2021-02-24: qty 2

## 2021-02-24 MED ORDER — CEFAZOLIN SODIUM-DEXTROSE 2-4 GM/100ML-% IV SOLN
2.0000 g | INTRAVENOUS | Status: AC
  Administered 2021-02-24: 2 g via INTRAVENOUS
  Filled 2021-02-24: qty 100

## 2021-02-24 MED ORDER — ONDANSETRON HCL 4 MG/2ML IJ SOLN
INTRAMUSCULAR | Status: DC | PRN
  Administered 2021-02-24: 4 mg via INTRAVENOUS

## 2021-02-24 MED ORDER — CELECOXIB 200 MG PO CAPS
200.0000 mg | ORAL_CAPSULE | Freq: Once | ORAL | Status: AC
  Administered 2021-02-24: 200 mg via ORAL
  Filled 2021-02-24: qty 1

## 2021-02-24 MED ORDER — TRANEXAMIC ACID-NACL 1000-0.7 MG/100ML-% IV SOLN
1000.0000 mg | INTRAVENOUS | Status: AC
  Administered 2021-02-24: 1000 mg via INTRAVENOUS
  Filled 2021-02-24: qty 100

## 2021-02-24 MED ORDER — HYDROMORPHONE HCL 1 MG/ML IJ SOLN: 0.5000 mg | INTRAMUSCULAR | Status: DC | PRN

## 2021-02-24 MED ORDER — ORAL CARE MOUTH RINSE: 15.0000 mL | Freq: Once | OROMUCOSAL | Status: AC

## 2021-02-24 MED ORDER — CHLORHEXIDINE GLUCONATE 0.12 % MT SOLN
15.0000 mL | Freq: Once | OROMUCOSAL | Status: AC
  Administered 2021-02-24: 15 mL via OROMUCOSAL
  Filled 2021-02-24: qty 15

## 2021-02-24 SURGICAL SUPPLY — 73 items
AID PSTN UNV HD RSTRNT DISP (MISCELLANEOUS) ×1
APL PRP STRL LF DISP 70% ISPRP (MISCELLANEOUS) ×1
BAG COUNTER SPONGE SURGICOUNT (BAG) ×2 IMPLANT
BAG SPNG CNTER NS LX DISP (BAG) ×1
BASEPLATE P2 COATD GLND 6.5X30 (Shoulder) IMPLANT
BIT DRILL 2.5 DIA 127 CALI (BIT) ×1 IMPLANT
BIT DRILL 4 DIA CALIBRATED (BIT) ×1 IMPLANT
BIT DRILL 5/64X5 DISP (BIT) IMPLANT
BLADE SAW SAG 73X25 THK (BLADE) ×1
BLADE SAW SGTL 73X25 THK (BLADE) ×1 IMPLANT
BLADE SURG 15 STRL LF DISP TIS (BLADE) IMPLANT
BLADE SURG 15 STRL SS (BLADE)
BOWL SMART MIX CTS (DISPOSABLE) IMPLANT
BSPLAT GLND 30 STRL LF SHLDR (Shoulder) ×1 IMPLANT
CHLORAPREP W/TINT 26 (MISCELLANEOUS) ×2 IMPLANT
COVER SURGICAL LIGHT HANDLE (MISCELLANEOUS) ×2 IMPLANT
DRAPE INCISE IOBAN 66X45 STRL (DRAPES) ×2 IMPLANT
DRAPE ORTHO SPLIT 77X108 STRL (DRAPES) ×4
DRAPE SURG ORHT 6 SPLT 77X108 (DRAPES) ×2 IMPLANT
DRSG AQUACEL AG ADV 3.5X 6 (GAUZE/BANDAGES/DRESSINGS) ×2 IMPLANT
ELECT BLADE 4.0 EZ CLEAN MEGAD (MISCELLANEOUS) ×2
ELECT REM PT RETURN 9FT ADLT (ELECTROSURGICAL) ×2
ELECTRODE BLDE 4.0 EZ CLN MEGD (MISCELLANEOUS) ×1 IMPLANT
ELECTRODE REM PT RTRN 9FT ADLT (ELECTROSURGICAL) ×1 IMPLANT
GLOVE SRG 8 PF TXTR STRL LF DI (GLOVE) ×1 IMPLANT
GLOVE SURG ENC MOIS LTX SZ7 (GLOVE) ×2 IMPLANT
GLOVE SURG ENC MOIS LTX SZ7.5 (GLOVE) ×2 IMPLANT
GLOVE SURG UNDER POLY LF SZ7 (GLOVE) ×2 IMPLANT
GLOVE SURG UNDER POLY LF SZ8 (GLOVE) ×2
GOWN STRL REUS W/ TWL LRG LVL3 (GOWN DISPOSABLE) ×3 IMPLANT
GOWN STRL REUS W/ TWL XL LVL3 (GOWN DISPOSABLE) ×1 IMPLANT
GOWN STRL REUS W/TWL LRG LVL3 (GOWN DISPOSABLE) ×6
GOWN STRL REUS W/TWL XL LVL3 (GOWN DISPOSABLE) ×2
HANDPIECE INTERPULSE COAX TIP (DISPOSABLE) ×2
HOOD PEEL AWAY FLYTE STAYCOOL (MISCELLANEOUS) ×4 IMPLANT
INSERT EPOLY STND HUMERUS 32MM (Shoulder) ×2 IMPLANT
INSERT EPOLYSTD HUMERUS 32MM (Shoulder) IMPLANT
KIT BASIN OR (CUSTOM PROCEDURE TRAY) ×2 IMPLANT
KIT TURNOVER KIT B (KITS) ×2 IMPLANT
MANIFOLD NEPTUNE II (INSTRUMENTS) ×2 IMPLANT
NDL MAYO TROCAR (NEEDLE) IMPLANT
NEEDLE MAYO TROCAR (NEEDLE) IMPLANT
NS IRRIG 1000ML POUR BTL (IV SOLUTION) ×2 IMPLANT
P2 COATDE GLNOID BSEPLT 6.5X30 (Shoulder) ×2 IMPLANT
PACK SHOULDER (CUSTOM PROCEDURE TRAY) ×2 IMPLANT
PAD ARMBOARD 7.5X6 YLW CONV (MISCELLANEOUS) ×2 IMPLANT
RESTRAINT HEAD UNIVERSAL NS (MISCELLANEOUS) ×2 IMPLANT
SCREW BONE LOCKING RSP 5.0X14 (Screw) ×4 IMPLANT
SCREW BONE LOCKING RSP 5.0X30 (Screw) ×4 IMPLANT
SCREW BONE RSP LOCK 5X14 (Screw) IMPLANT
SCREW BONE RSP LOCK 5X30 (Screw) IMPLANT
SCREW RETAIN W/HEAD 32MM (Shoulder) ×1 IMPLANT
SET HNDPC FAN SPRY TIP SCT (DISPOSABLE) ×1 IMPLANT
SLING ARM FOAM STRAP LRG (SOFTGOODS) ×1 IMPLANT
SLING ARM FOAM STRAP MED (SOFTGOODS) ×1 IMPLANT
SPONGE T-LAP 18X18 ~~LOC~~+RFID (SPONGE) ×1 IMPLANT
SPONGE T-LAP 4X18 ~~LOC~~+RFID (SPONGE) ×1 IMPLANT
STEM HUMERAL 12X48 STD SHORT (Shoulder) ×1 IMPLANT
STRIP CLOSURE SKIN 1/2X4 (GAUZE/BANDAGES/DRESSINGS) ×2 IMPLANT
SUCTION FRAZIER HANDLE 10FR (MISCELLANEOUS) ×2
SUCTION TUBE FRAZIER 10FR DISP (MISCELLANEOUS) ×1 IMPLANT
SUPPORT WRAP ARM LG (MISCELLANEOUS) ×2 IMPLANT
SUT ETHIBOND NAB CT1 #1 30IN (SUTURE) ×2 IMPLANT
SUT FIBERWIRE #2 38 T-5 BLUE (SUTURE)
SUT MNCRL AB 4-0 PS2 18 (SUTURE) ×3 IMPLANT
SUT SILK 2 0 TIES 17X18 (SUTURE)
SUT SILK 2-0 18XBRD TIE BLK (SUTURE) IMPLANT
SUT VIC AB 2-0 CT1 27 (SUTURE) ×4
SUT VIC AB 2-0 CT1 TAPERPNT 27 (SUTURE) ×2 IMPLANT
SUTURE FIBERWR #2 38 T-5 BLUE (SUTURE) IMPLANT
TOWEL GREEN STERILE (TOWEL DISPOSABLE) ×2 IMPLANT
WATER STERILE IRR 1000ML POUR (IV SOLUTION) ×1 IMPLANT
YANKAUER SUCT BULB TIP NO VENT (SUCTIONS) IMPLANT

## 2021-02-24 NOTE — Transfer of Care (Signed)
Immediate Anesthesia Transfer of Care Note  Patient: Luis Robles  Procedure(s) Performed: RIGHT REVERSE SHOULDER ARTHROPLASTY (Right: Shoulder)  Patient Location: PACU  Anesthesia Type:General  Level of Consciousness: awake, alert  and oriented  Airway & Oxygen Therapy: Patient Spontanous Breathing  Post-op Assessment: Post -op Vital signs reviewed and stable  Post vital signs: stable  Last Vitals:  Vitals Value Taken Time  BP 127/51 02/24/21 1537  Temp    Pulse 52 02/24/21 1537  Resp 14 02/24/21 1538  SpO2 100 % 02/24/21 1537  Vitals shown include unvalidated device data.  Last Pain:  Vitals:   02/24/21 1224  TempSrc:   PainSc: 0-No pain         Complications: No notable events documented.

## 2021-02-24 NOTE — Op Note (Signed)
Procedure(s): RIGHT REVERSE SHOULDER ARTHROPLASTY Procedure Note  KAYLOR SIMENSON male 76 y.o. 02/24/2021   Preoperative diagnosis: Right shoulder massive rotator cuff tear after dislocation  Postoperative diagnosis: Same  Procedure(s) and Anesthesia Type:    * RIGHT REVERSE SHOULDER ARTHROPLASTY - General   Indications:  76 y.o. male status post fall with right shoulder dislocation.  He had an associated massive rotator cuff tear and presented to me about 2 months out from injury.  He had severe stiffness and loss of movement with complete pseudoparalysis.  We had a long discussion about potential treatment options including attempt at rotator cuff repair versus reverse total shoulder arthroplasty.  I felt that reverse shoulder arthroplasty would provide the most predictable result with least likelihood of requiring additional surgery.  He understands and agrees.     Surgeon: Rhae Hammock   Assistants: Sheryle Hail PA-C Amber was present and scrubbed throughout the procedure and was essential in positioning, retraction, exposure, and closure)  Anesthesia: General endotracheal anesthesia with preoperative interscalene block given by the attending anesthesiologist    Procedure Detail  RIGHT REVERSE SHOULDER ARTHROPLASTY   Estimated Blood Loss:  200 mL         Drains: none  Blood Given: none          Specimens: none        Complications:  * No complications entered in OR log *         Disposition: PACU - hemodynamically stable.         Condition: stable      OPERATIVE FINDINGS:  A DJO Altivate pressfit reverse total shoulder arthroplasty was placed with a  size 12 stem, a 32 standard glenosphere, and a standard-mm poly insert. The base plate  fixation was excellent.  PROCEDURE: The patient was identified in the preoperative holding area  where I personally marked the operative site after verifying site, side,  and procedure with the patient. An  interscalene block given by  the attending anesthesiologist in the holding area and the patient was taken back to the operating room where all extremities were  carefully padded in position after general anesthesia was induced. She  was placed in a beach-chair position and the operative upper extremity was  prepped and draped in a standard sterile fashion. An approximately 10-  cm incision was made from the tip of the coracoid process to the center  point of the humerus at the level of the axilla. Dissection was carried  down through subcutaneous tissues to the level of the cephalic vein  which was taken laterally with the deltoid. The pectoralis major was  retracted medially. The subdeltoid space was developed and the lateral  edge of the conjoined tendon was identified. The undersurface of  conjoined tendon was palpated and the musculocutaneous nerve was not in  the field. Retractor was placed underneath the conjoined and second  retractor was placed lateral into the deltoid. The circumflex humeral  artery and vessels were identified and clamped and coagulated. The  biceps tendon was tenodesed to the upper border of the pectoralis major.  The subscapularis was taken down as a peel with the underlying capsule.  The  joint was then gently externally rotated while the capsule was released  from the humeral neck around to just beyond the 6 o'clock position. At  this point, the joint was dislocated and the humeral head was presented  into the wound. The excessive osteophyte formation was removed with a  large rongeur.  The cutting guide was used to make the appropriate  head cut and the head was saved for potentially bone grafting.  The glenoid was exposed with the arm in an  abducted extended position. The anterior and posterior labrum were  completely excised and the capsule was released circumferentially to  allow for exposure of the glenoid for preparation. The 2.5 mm drill was  placed  using the guide in 5-10 inferior angulation and the tap was then advanced in the same hole. Small and large reamers were then used. The tap was then removed and the Metaglene was then screwed in with excellent purchase.  The peripheral guide was then used to drilled measured and filled peripheral locking screws. The size 32 standard glenosphere was then impacted on the [facility] taper and the central screw was placed. The humerus was then again exposed and the diaphyseal reamers were used followed by the metaphyseal reamers. The final broach was left in place in the proximal trial was placed. The joint was reduced and with this implant it was felt that soft tissue tensioning was appropriate with excellent stability and excellent range of motion. Therefore, final humeral stem was placed press-fit.  And then the trial polyethylene inserts were tested again and the above implant was felt to be the most appropriate for final insertion. The joint was reduced taken through full range of motion and felt to be stable. Soft tissue tension was appropriate.  The joint was then copiously irrigated with pulse  lavage and the wound was then closed. The subscapularis was degenerative and very tight and was not repaired.  Skin was closed with 2-0 Vicryl in a deep dermal layer and 4-0  Monocryl for skin closure. Steri-Strips were applied. Sterile  dressings were then applied as well as a sling. The patient was allowed  to awaken from general anesthesia, transferred to stretcher, and taken  to recovery room in stable condition.   POSTOPERATIVE PLAN: The patient will be observed in the recovery room and if his pain is well controlled with the regional block and he is hemodynamically stable he could be discharged home today with family.

## 2021-02-24 NOTE — Anesthesia Procedure Notes (Addendum)
Anesthesia Regional Block: Interscalene brachial plexus block   Pre-Anesthetic Checklist: , timeout performed,  Correct Patient, Correct Site, Correct Laterality,  Correct Procedure, Correct Position, site marked,  Risks and benefits discussed,  Surgical consent,  Pre-op evaluation,  At surgeon's request and post-op pain management  Laterality: Right  Prep: chloraprep       Needles:  Injection technique: Single-shot  Needle Type: Echogenic Stimulator Needle     Needle Length: 5cm  Needle Gauge: 22     Additional Needles:   Procedures:,,,, ultrasound used (permanent image in chart),,    Narrative:  Start time: 02/24/2021 1:24 PM End time: 02/24/2021 1:34 PM Injection made incrementally with aspirations every 5 mL.  Performed by: Personally  Anesthesiologist: Duane Boston, MD  Additional Notes: Functioning IV was confirmed and monitors applied.  A 6mm 22ga echogenic arrow stimulator was used. Sterile prep and drape,hand hygiene and sterile gloves were used.Ultrasound guidance: relevant anatomy identified, needle position confirmed, local anesthetic spread visualized around nerve(s)., vascular puncture avoided.  Image printed for medical record.  Negative aspiration and negative test dose prior to incremental administration of local anesthetic. The patient tolerated the procedure well.

## 2021-02-24 NOTE — Anesthesia Postprocedure Evaluation (Signed)
Anesthesia Post Note  Patient: Luis Robles  Procedure(s) Performed: RIGHT REVERSE SHOULDER ARTHROPLASTY (Right: Shoulder)     Patient location during evaluation: PACU Anesthesia Type: General Level of consciousness: sedated Pain management: pain level controlled Vital Signs Assessment: post-procedure vital signs reviewed and stable Respiratory status: spontaneous breathing and respiratory function stable Cardiovascular status: stable Postop Assessment: no apparent nausea or vomiting Anesthetic complications: no   No notable events documented.  Last Vitals:  Vitals:   02/24/21 1622 02/24/21 1624  BP: 123/64   Pulse: (!) 45 (!) 50  Resp: 12 13  Temp:  (!) 36.1 C  SpO2: 100% 100%    Last Pain:  Vitals:   02/24/21 1624  TempSrc:   PainSc: 0-No pain                 Jafet Wissing DANIEL

## 2021-02-24 NOTE — H&P (Signed)
Luis Robles is an 76 y.o. male.   Chief Complaint: R shoulder pain and dysfunction HPI: s/p R shoulder dislocation with pseudoparalysis massive RCT, failed conservative treatment.  Past Medical History:  Diagnosis Date   Anemia    hgb 11 in 2016   Aortic sclerosis    Coronary artery disease    Hypercholesteremia    Perirectal abscess     Past Surgical History:  Procedure Laterality Date   COLONOSCOPY     NO PAST SURGERIES      Family History  Problem Relation Age of Onset   Colon cancer Father    Social History:  reports that he has never smoked. He has never used smokeless tobacco. He reports that he does not drink alcohol and does not use drugs.  Allergies: Not on File  Medications Prior to Admission  Medication Sig Dispense Refill   cyanocobalamin 500 MCG tablet Take 500 mcg by mouth every other day.     ezetimibe (ZETIA) 10 MG tablet TAKE ONE TABLET BY MOUTH ONE TIME DAILY 90 tablet 0   metFORMIN (GLUCOPHAGE) 850 MG tablet Take 850 mg by mouth 2 (two) times daily.     Omega-3 Fatty Acids (FISH OIL) 1000 MG CAPS Take 1,000 mg by mouth 2 (two) times daily.     rosuvastatin (CRESTOR) 20 MG tablet TAKE ONE TABLET BY MOUTH ONE TIME DAILY 90 tablet 0    Results for orders placed or performed during the hospital encounter of 02/24/21 (from the past 48 hour(s))  Basic metabolic panel per protocol     Status: Abnormal   Collection Time: 02/24/21 11:54 AM  Result Value Ref Range   Sodium 139 135 - 145 mmol/L   Potassium 5.9 (H) 3.5 - 5.1 mmol/L   Chloride 103 98 - 111 mmol/L   CO2 25 22 - 32 mmol/L   Glucose, Bld 94 70 - 99 mg/dL    Comment: Glucose reference range applies only to samples taken after fasting for at least 8 hours.   BUN 15 8 - 23 mg/dL   Creatinine, Ser 0.72 0.61 - 1.24 mg/dL   Calcium 9.7 8.9 - 10.3 mg/dL   GFR, Estimated >60 >60 mL/min    Comment: (NOTE) Calculated using the CKD-EPI Creatinine Equation (2021)    Anion gap 11 5 - 15    Comment:  Performed at [facility] [address], [place], [place] [address]  CBC per protocol     Status: Abnormal   Collection Time: 02/24/21 11:54 AM  Result Value Ref Range   WBC 6.4 4.0 - 10.5 K/uL   RBC 5.90 (H) 4.22 - 5.81 MIL/uL   Hemoglobin 13.5 13.0 - 17.0 g/dL   HCT 43.7 39.0 - 52.0 %   MCV 74.1 (L) 80.0 - 100.0 fL   MCH 22.9 (L) 26.0 - 34.0 pg   MCHC 30.9 30.0 - 36.0 g/dL   RDW 14.8 11.5 - 15.5 %   Platelets 254 150 - 400 K/uL   nRBC 0.0 0.0 - 0.2 %    Comment: Performed at [facility], [place] [address] [place], [place] [address]  Type and screen Order type and screen if day of surgery is less than 15 days from draw of preadmission visit or order morning of surgery if day of surgery is greater than 6 days from preadmission visit.     Status: None   Collection Time: 02/24/21 12:00 PM  Result Value Ref Range   ABO/RH(D) Jenetta Downer  POS    Antibody Screen NEG    Sample Expiration      02/27/2021,2359 Performed at [facility], [place] [address] [place], [place] [address]   ABO/Rh     Status: None   Collection Time: 02/24/21 12:10 PM  Result Value Ref Range   ABO/RH(D)      O POS Performed at [facility] [address] [place],  [address]    DG Chest 2 View  Result Date: 02/24/2021 CLINICAL DATA:  Preop for shoulder arthroplasty. EXAM: CHEST - 2 VIEW COMPARISON:  October 08, 2020. FINDINGS: The heart size and mediastinal contours are within normal limits. Stable multiple small calcified granulomas. No acute pulmonary disease is noted. The visualized skeletal structures are unremarkable. IMPRESSION: No active cardiopulmonary disease. Electronically Signed   By: Marijo Conception M.D.   On: 02/24/2021 12:58    Review of Systems  All other systems reviewed and are negative.  Blood pressure (!) 165/59, pulse (!) 43, temperature 97.8 F (36.6 C), temperature source Oral, resp. rate 16, height 5\' 8"  (1.727 m), weight 64.9 kg, SpO2 100 %. Physical  Exam Constitutional:      Appearance: He is well-developed.  HENT:     Head: Atraumatic.  Eyes:     Extraocular Movements: Extraocular movements intact.  Cardiovascular:     Pulses: Normal pulses.  Pulmonary:     Effort: Pulmonary effort is normal.  Musculoskeletal:     Comments: R shoulder pain with no active FF. NVID  Skin:    General: Skin is warm and dry.  Neurological:     Mental Status: He is alert and oriented to person, place, and time.  Psychiatric:        Mood and Affect: Mood normal.     Assessment/Plan Plan R reverse TSA Risks / benefits of surgery discussed Consent on chart  NPO for OR Preop antibiotics   Rhae Hammock, MD 02/24/2021, 1:30 PM

## 2021-02-24 NOTE — Anesthesia Procedure Notes (Signed)
Procedure Name: Intubation Date/Time: 02/24/2021 2:07 PM Performed by: Lavell Luster, CRNA Pre-anesthesia Checklist: Patient identified, Emergency Drugs available, Suction available, Patient being monitored and Timeout performed Patient Re-evaluated:Patient Re-evaluated prior to induction Oxygen Delivery Method: Circle system utilized Preoxygenation: Pre-oxygenation with 100% oxygen Induction Type: IV induction Ventilation: Mask ventilation without difficulty Laryngoscope Size: Mac and 4 Grade View: Grade I Tube type: Oral Tube size: 7.5 mm Number of attempts: 1 Airway Equipment and Method: Stylet Placement Confirmation: ETT inserted through vocal cords under direct vision, positive ETCO2 and breath sounds checked- equal and bilateral Secured at: 22 cm Tube secured with: Tape Dental Injury: Teeth and Oropharynx as per pre-operative assessment

## 2021-02-24 NOTE — Discharge Instructions (Addendum)

## 2021-02-27 ENCOUNTER — Encounter (HOSPITAL_COMMUNITY): Payer: Self-pay | Admitting: Orthopedic Surgery

## 2021-03-09 ENCOUNTER — Ambulatory Visit: Payer: PPO | Admitting: Cardiovascular Disease

## 2021-03-09 DIAGNOSIS — Z9889 Other specified postprocedural states: Secondary | ICD-10-CM | POA: Diagnosis not present

## 2021-03-09 DIAGNOSIS — S46011A Strain of muscle(s) and tendon(s) of the rotator cuff of right shoulder, initial encounter: Secondary | ICD-10-CM | POA: Diagnosis not present

## 2021-03-10 ENCOUNTER — Ambulatory Visit: Payer: PPO | Admitting: Cardiovascular Disease

## 2021-03-19 IMAGING — CT CT CARDIAC CORONARY ARTERY CALCIUM SCORE
3 series · 14 of 20 positions shown, 15 images · non-contrast
Comparison: None.
COMPARISON: None.

Addendum:
EXAM:
OVER-READ INTERPRETATION  CT CHEST

The following report is an over-read performed by radiologist Dr.
Evanilton Braga Da Silva [REDACTED] on 12/11/2019. This
over-read does not include interpretation of cardiac or coronary
anatomy or pathology. The coronary calcium score interpretation by
the cardiologist is attached.
CLINICAL DATA: Risk stratification
Coronary Calcium Score
TECHNIQUE: The patient was scanned on a Siemens Force scanner. Axial
non-contrast 3 mm slices were carried out through the heart. The
data set was analyzed on a dedicated work station and scored using
the Agatson method.

[Series 2: casc 3.0 bv41 2 bestdiast 71 % · axial · 0.39mm/px · z∈[-228,-150]mm · 4 of 44 slices shown, 5 images]
[im 9/44  vessel]
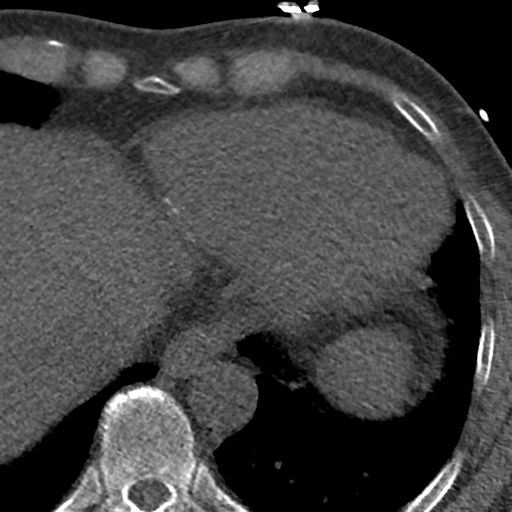
[im 9/44  lung]
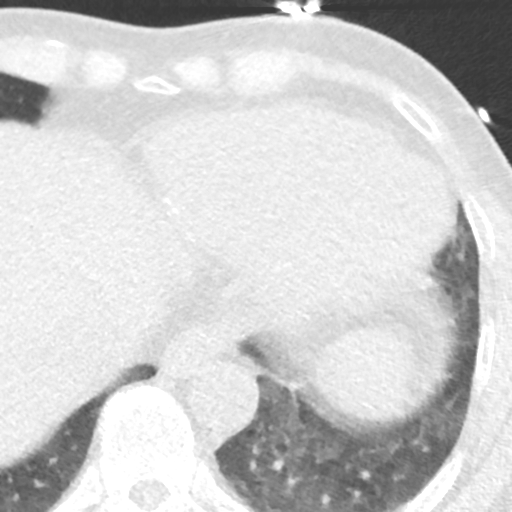
[im 18/44  vessel]
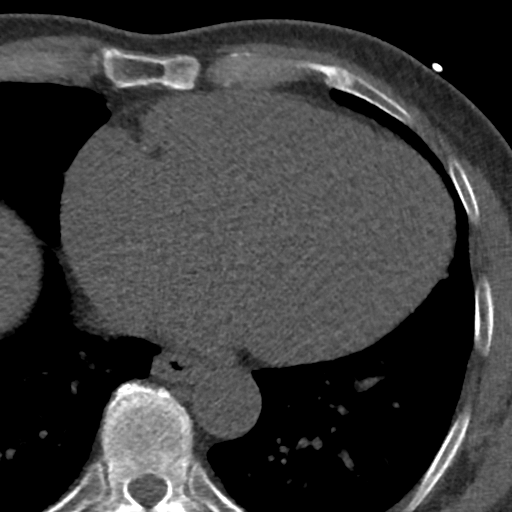
[im 26/44  vessel]
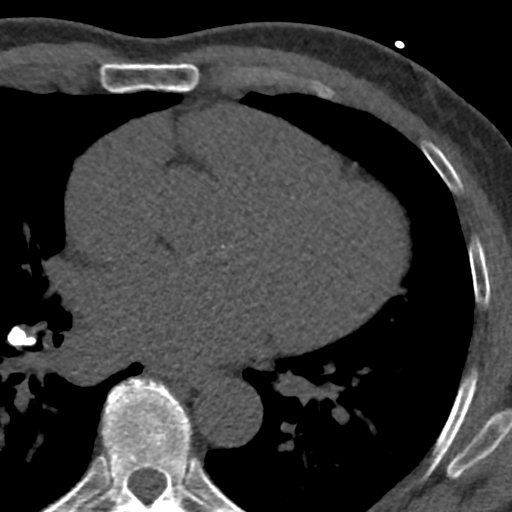
[im 35/44  vessel]
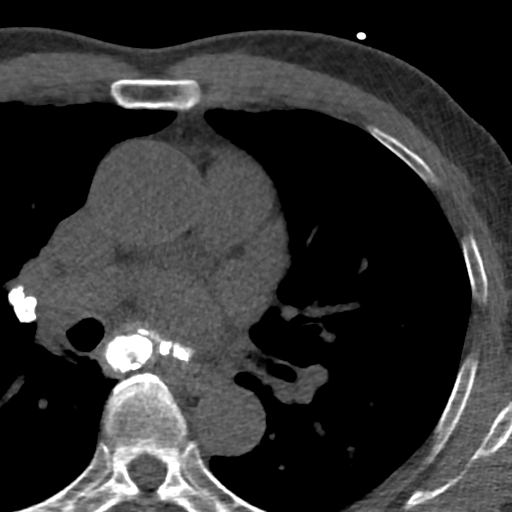

[Series 3: lung 71 % · axial · 0.68mm/px · z∈[-234,-146]mm · 5 of 45 slices shown]
[im 8/45  lung]
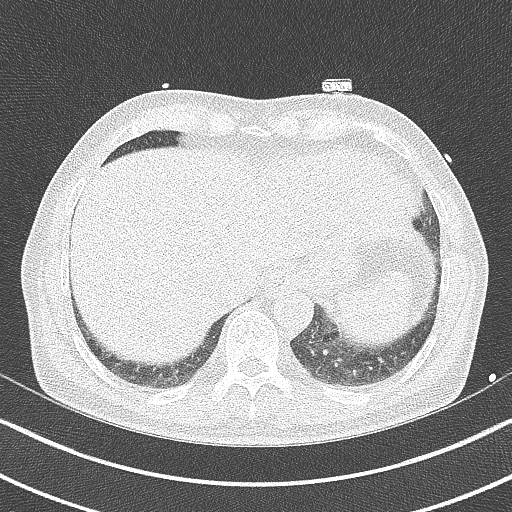
[im 15/45  lung]
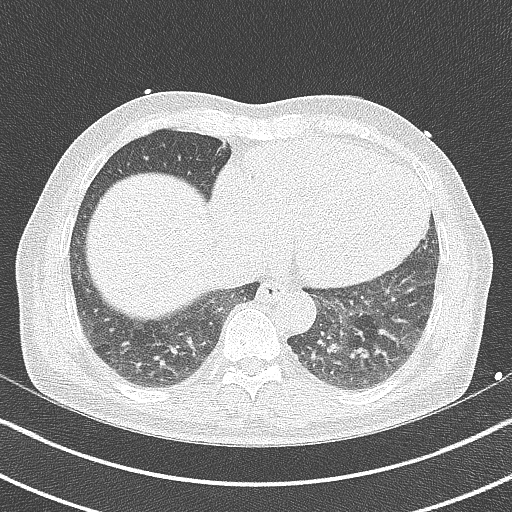
[im 23/45  lung]
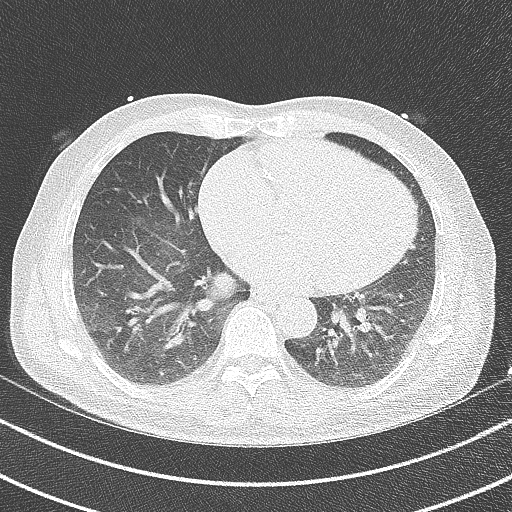
[im 30/45  lung]
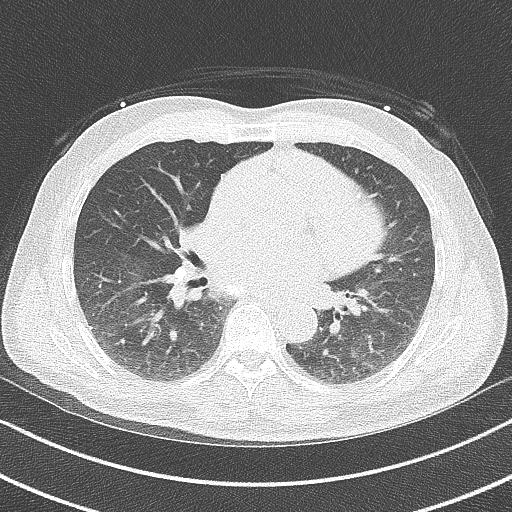
[im 37/45  lung]
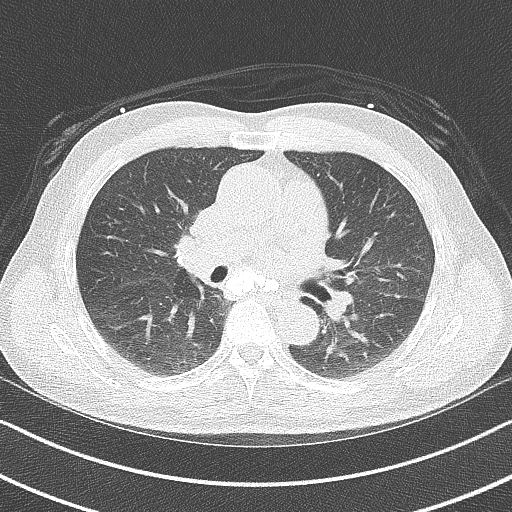

[Series 4: lung st 71 % · axial · 0.68mm/px · z∈[-234,-146]mm · 5 of 45 slices shown]
[im 8/45  lung]
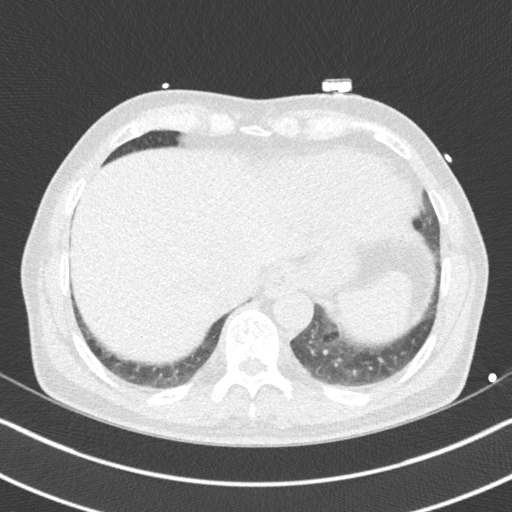
[im 15/45  lung]
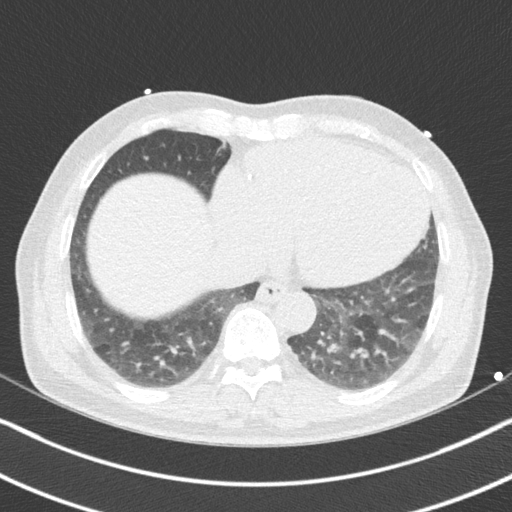
[im 23/45  lung]
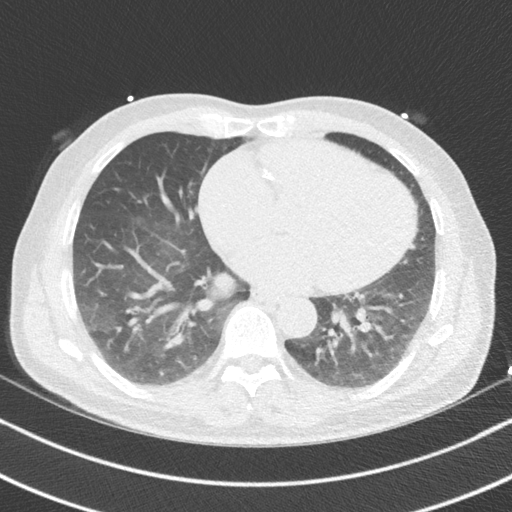
[im 30/45  lung]
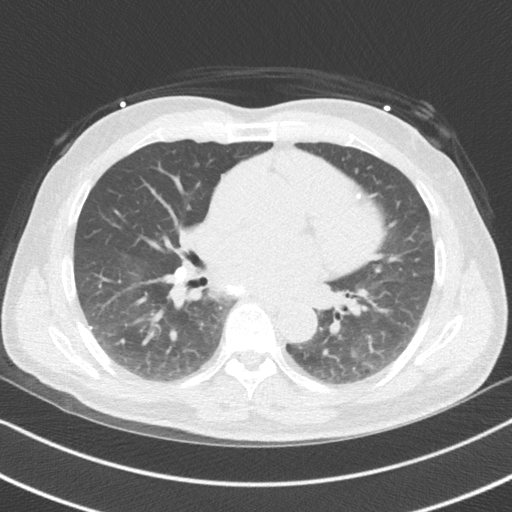
[im 37/45  lung]
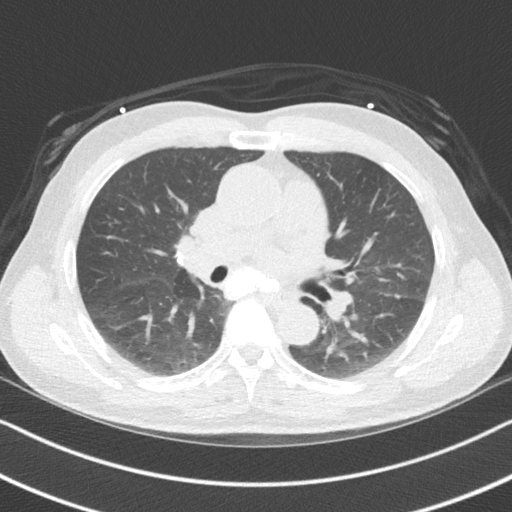

[14 of 20 positions shown; findings below may reference images not displayed]

FINDINGS: Aortic atherosclerosis. Multiple densely calcified mediastinal and
right hilar lymph nodes are incidentally noted. Several calcified
granulomas in the right lower lobe. Within the visualized portions
of the thorax there are no suspicious appearing pulmonary nodules or
masses, there is no acute consolidative airspace disease, no pleural
effusions, no pneumothorax and no lymphadenopathy. Visualized
portions of the upper abdomen are unremarkable. There are no
aggressive appearing lytic or blastic lesions noted in the
visualized portions of the skeleton.
IMPRESSION: 1.  Aortic Atherosclerosis (HMC3S-2DS.S).
2. Old granulomatous disease, as above.
FINDINGS: Non-cardiac: See separate report from [REDACTED].

Ascending Aorta: Normal caliber.

Pericardium: Normal.

Coronary arteries: Normal origins.
IMPRESSION: Coronary calcium score of 676. This was 72nd percentile for age and
sex matched controls.

*** End of Addendum ***
EXAM:
OVER-READ INTERPRETATION  CT CHEST

The following report is an over-read performed by radiologist Dr.
Evanilton Braga Da Silva [REDACTED] on 12/11/2019. This
over-read does not include interpretation of cardiac or coronary
anatomy or pathology. The coronary calcium score interpretation by
the cardiologist is attached.
FINDINGS: Aortic atherosclerosis. Multiple densely calcified mediastinal and
right hilar lymph nodes are incidentally noted. Several calcified
granulomas in the right lower lobe. Within the visualized portions
of the thorax there are no suspicious appearing pulmonary nodules or
masses, there is no acute consolidative airspace disease, no pleural
effusions, no pneumothorax and no lymphadenopathy. Visualized
portions of the upper abdomen are unremarkable. There are no
aggressive appearing lytic or blastic lesions noted in the
visualized portions of the skeleton.
IMPRESSION: 1.  Aortic Atherosclerosis (HMC3S-2DS.S).
2. Old granulomatous disease, as above.

## 2021-04-14 DIAGNOSIS — Z96611 Presence of right artificial shoulder joint: Secondary | ICD-10-CM | POA: Diagnosis not present

## 2021-04-14 DIAGNOSIS — M25611 Stiffness of right shoulder, not elsewhere classified: Secondary | ICD-10-CM | POA: Diagnosis not present

## 2021-04-19 DIAGNOSIS — Z96611 Presence of right artificial shoulder joint: Secondary | ICD-10-CM | POA: Diagnosis not present

## 2021-04-19 DIAGNOSIS — M25611 Stiffness of right shoulder, not elsewhere classified: Secondary | ICD-10-CM | POA: Diagnosis not present

## 2021-04-20 DIAGNOSIS — M25611 Stiffness of right shoulder, not elsewhere classified: Secondary | ICD-10-CM | POA: Diagnosis not present

## 2021-04-20 DIAGNOSIS — Z96611 Presence of right artificial shoulder joint: Secondary | ICD-10-CM | POA: Diagnosis not present

## 2021-04-27 DIAGNOSIS — Z96611 Presence of right artificial shoulder joint: Secondary | ICD-10-CM | POA: Diagnosis not present

## 2021-04-27 DIAGNOSIS — M25611 Stiffness of right shoulder, not elsewhere classified: Secondary | ICD-10-CM | POA: Diagnosis not present

## 2021-04-29 DIAGNOSIS — M25611 Stiffness of right shoulder, not elsewhere classified: Secondary | ICD-10-CM | POA: Diagnosis not present

## 2021-04-29 DIAGNOSIS — Z96611 Presence of right artificial shoulder joint: Secondary | ICD-10-CM | POA: Diagnosis not present

## 2021-05-02 DIAGNOSIS — Z96611 Presence of right artificial shoulder joint: Secondary | ICD-10-CM | POA: Diagnosis not present

## 2021-05-02 DIAGNOSIS — M25611 Stiffness of right shoulder, not elsewhere classified: Secondary | ICD-10-CM | POA: Diagnosis not present

## 2021-05-02 NOTE — Progress Notes (Signed)
?Cardiology Office Note:   ?Date:  05/04/2021  ?NAME:  Luis Robles    ?MRN: [id number] ?DOB:  1945/10/24  ? ?PCP:  Pcp, No  ?Cardiologist:  None  ?Electrophysiologist:  None  ? ?Referring MD: Marius Ditch, MD  ? ?Chief Complaint  ?Patient presents with  ? Follow-up  ?  12 months.  ? ? ?History of Present Illness:   ?Luis Robles is a 76 y.o. male with a hx of coronary calcium who presents for follow-up.  Reports he is doing well.  Denies any chest pain or trouble breathing.  Still exercising twice per day.  Does 30 minutes in the morning as well as swimming in the afternoon.  No chest pain or trouble breathing.  Most recent LDL cholesterol was 55.  He reports a home test showed it was in the 20s.  Overall doing well.  He request carotid intimal thickness screening.  We will set him up for this.  Denies any symptoms in office today. ? ?Problem List ?1. CAD  ?-CAC score 676 (72nd percentile) ?2. HLD ?-T chol 140, HDL 71, LDL 55, TG 67 ? ?Past Medical History: ?Past Medical History:  ?Diagnosis Date  ? Anemia   ? hgb 11 in 2016  ? Aortic sclerosis   ? Coronary artery disease   ? Hypercholesteremia   ? Perirectal abscess   ? ? ?Past Surgical History: ?Past Surgical History:  ?Procedure Laterality Date  ? COLONOSCOPY    ? NO PAST SURGERIES    ? REVERSE SHOULDER ARTHROPLASTY Right 02/24/2021  ? Procedure: RIGHT REVERSE SHOULDER ARTHROPLASTY;  Surgeon: Tania Ade, MD;  Location: [place];  Service: Orthopedics;  Laterality: Right;  ? ? ?Current Medications: ?Current Meds  ?Medication Sig  ? cyanocobalamin 500 MCG tablet Take 500 mcg by mouth every other day.  ? ezetimibe (ZETIA) 10 MG tablet TAKE ONE TABLET BY MOUTH ONE TIME DAILY  ? metFORMIN (GLUCOPHAGE) 850 MG tablet Take 850 mg by mouth 2 (two) times daily.  ? methocarbamol (ROBAXIN) 500 MG tablet Take 1 tablet (500 mg total) by mouth every 8 (eight) hours as needed for muscle spasms.  ? Omega-3 Fatty Acids (FISH OIL) 1000 MG CAPS Take 1,000 mg by mouth 2  (two) times daily.  ? rosuvastatin (CRESTOR) 20 MG tablet TAKE ONE TABLET BY MOUTH ONE TIME DAILY  ? [DISCONTINUED] oxyCODONE (ROXICODONE) 5 MG immediate release tablet Take 1 tablet (5 mg total) by mouth every 6 (six) hours as needed for severe pain.  ?  ? ?Allergies:    ?Patient has no known allergies.  ? ?Social History: ?Social History  ? ?Socioeconomic History  ? Marital status: Married  ?  Spouse name: Not on file  ? Number of children: Not on file  ? Years of education: Not on file  ? Highest education level: Not on file  ?Occupational History  ? Occupation: retired Education officer, community  ?Tobacco Use  ? Smoking status: Never  ? Smokeless tobacco: Never  ?Vaping Use  ? Vaping Use: Never used  ?Substance and Sexual Activity  ? Alcohol use: No  ? Drug use: No  ? Sexual activity: Not on file  ?Other Topics Concern  ? Not on file  ?Social History Narrative  ? Not on file  ? ?Social Determinants of Health  ? ?Financial Resource Strain: Not on file  ?Food Insecurity: Not on file  ?Transportation Needs: Not on file  ?Physical Activity: Not on file  ?Stress: Not on file  ?Social  Connections: Not on file  ?  ? ?Family History: ?The patient's family history includes Colon cancer in his father. ? ?ROS:   ?All other ROS reviewed and negative. Pertinent positives noted in the HPI.    ? ?EKGs/Labs/Other Studies Reviewed:   ?The following studies were personally reviewed by me today: ? ?Calcium Score 12/11/2019 ?Coronary calcium score of 676. This was 72nd percentile for age and ?sex matched controls. ? ?Recent Labs: ?02/24/2021: BUN 15; Creatinine, Ser 0.72; Hemoglobin 13.5; Platelets 254; Potassium 5.9; Sodium 139  ? ?Recent Lipid Panel ?   ?Component Value Date/Time  ? CHOL 140 02/16/2020 1616  ? TRIG 67 02/16/2020 1616  ? HDL 71 02/16/2020 1616  ? CHOLHDL 2.0 02/16/2020 1616  ? LDLCALC 55 02/16/2020 1616  ? ? ?Physical Exam:   ?VS:  BP (!) 124/58 (BP Location: Left Arm, Patient Position: Sitting, Cuff Size: Normal)   Pulse  (!) 56   Ht '5\' 8"'$  (1.727 m)   Wt 154 lb (69.9 kg)   BMI 23.42 kg/m?    ?Wt Readings from Last 3 Encounters:  ?05/04/21 154 lb (69.9 kg)  ?02/24/21 143 lb (64.9 kg)  ?02/19/20 149 lb (67.6 kg)  ?  ?General: Well nourished, well developed, in no acute distress ?Head: Atraumatic, normal size  ?Eyes: PEERLA, EOMI  ?Neck: Supple, no JVD ?Endocrine: No thryomegaly ?Cardiac: Normal S1, S2; RRR; no murmurs, rubs, or gallops ?Lungs: Clear to auscultation bilaterally, no wheezing, rhonchi or rales  ?Abd: Soft, nontender, no hepatomegaly  ?Ext: No edema, pulses 2+ ?Musculoskeletal: No deformities, BUE and BLE strength normal and equal ?Skin: Warm and dry, no rashes   ?Neuro: Alert and oriented to person, place, time, and situation, CNII-XII grossly intact, no focal deficits  ?Psych: Normal mood and affect  ? ?ASSESSMENT:   ?Luis Robles is a 76 y.o. male who presents for the following: ?1. Agatston coronary artery calcium score greater than 400   ?2. Mixed hyperlipidemia   ? ? ?PLAN:   ?1. Agatston coronary artery calcium score greater than 400 ?2. Mixed hyperlipidemia ?-Elevated calcium score.  No symptoms of angina.  Currently on Crestor 20 mg daily as well as Zetia 10 mg daily.  Most recent LDL cholesterol 55.  He will continue with this regimen.  We will set him up for carotid intimal thickness screening.  He request this.  Overall he will see [place] yearly.  Denies any symptoms of angina. ? ?Disposition: Return in about 1 year (around 05/05/2022). ? ?Medication Adjustments/Labs and Tests Ordered: ?Current medicines are reviewed at length with the patient today.  Concerns regarding medicines are outlined above.  ?Orders Placed This Encounter  ?Procedures  ? VAS US VASCUSCREEN  ? ?No orders of the defined types were placed in this encounter. ? ? ?Patient Instructions  ?Medication Instructions:  ?The current medical regimen is effective;  continue present plan and medications. ? ?*If you need a refill on your cardiac  medications before your next appointment, please call your pharmacy* ? ? ?Testing/Procedures: ?Vascular Screening- NORTHLINE today  ? ? ?Follow-Up: ?At [facility], you and your health needs are our priority.  As part of our continuing mission to provide you with exceptional heart care, we have created designated Provider Care Teams.  These Care Teams include your primary Cardiologist (physician) and Advanced Practice Providers (APPs -  Physician Assistants and Nurse Practitioners) who all work together to provide you with the care you need, when you need it. ? ?We recommend signing up  for the patient portal called "MyChart".  Sign up information is provided on this After Visit Summary.  MyChart is used to connect with patients for Virtual Visits (Telemedicine).  Patients are able to view lab/test results, encounter notes, upcoming appointments, etc.  Non-urgent messages can be sent to your provider as well.   ?To learn more about what you can do with MyChart, go to [email or web address].   ? ?Your next appointment:   ?12 month(s) ? ?The format for your next appointment:   ?In Person ? ?Provider:   ?Eleonore Chiquito, MD  ? ?  ? ?Time Spent with Patient: I have spent a total of 25 minutes with patient reviewing hospital notes, telemetry, EKGs, labs and examining the patient as well as establishing an assessment and plan that was discussed with the patient.  > 50% of time was spent in direct patient care. ? ?Signed, ?[place] T. Audie Box, MD, [facility] ?[address] ?[place], [place] [address] ?[phone]  ?05/04/2021 9:38 AM    ? ?

## 2021-05-04 ENCOUNTER — Other Ambulatory Visit: Payer: Self-pay | Admitting: Cardiovascular Disease

## 2021-05-04 ENCOUNTER — Other Ambulatory Visit: Payer: Self-pay

## 2021-05-04 ENCOUNTER — Ambulatory Visit (HOSPITAL_COMMUNITY)
Admission: RE | Admit: 2021-05-04 | Discharge: 2021-05-04 | Disposition: A | Discharge status: Home / Self Care | Payer: PPO | Source: Ambulatory Visit | Attending: Cardiovascular Disease | Admitting: Cardiovascular Disease

## 2021-05-04 ENCOUNTER — Encounter: Payer: Self-pay | Admitting: Cardiovascular Disease

## 2021-05-04 ENCOUNTER — Ambulatory Visit: Payer: PPO | Admitting: Cardiovascular Disease

## 2021-05-04 VITALS — BP 124/58 | HR 56 | Ht 68.0 in | Wt 154.0 lb

## 2021-05-04 DIAGNOSIS — M25611 Stiffness of right shoulder, not elsewhere classified: Secondary | ICD-10-CM | POA: Diagnosis not present

## 2021-05-04 DIAGNOSIS — R931 Abnormal findings on diagnostic imaging of heart and coronary circulation: Secondary | ICD-10-CM | POA: Diagnosis not present

## 2021-05-04 DIAGNOSIS — E782 Mixed hyperlipidemia: Secondary | ICD-10-CM

## 2021-05-04 DIAGNOSIS — Z96611 Presence of right artificial shoulder joint: Secondary | ICD-10-CM | POA: Diagnosis not present

## 2021-05-04 NOTE — Patient Instructions (Signed)
Medication Instructions:  ?The current medical regimen is effective;  continue present plan and medications. ? ?*If you need a refill on your cardiac medications before your next appointment, please call your pharmacy* ? ? ?Testing/Procedures: ?Vascular Screening- NORTHLINE today  ? ? ?Follow-Up: ?At [facility], you and your health needs are our priority.  As part of our continuing mission to provide you with exceptional heart care, we have created designated Provider Care Teams.  These Care Teams include your primary Cardiologist (physician) and Advanced Practice Providers (APPs -  Physician Assistants and Nurse Practitioners) who all work together to provide you with the care you need, when you need it. ? ?We recommend signing up for the patient portal called "MyChart".  Sign up information is provided on this After Visit Summary.  MyChart is used to connect with patients for Virtual Visits (Telemedicine).  Patients are able to view lab/test results, encounter notes, upcoming appointments, etc.  Non-urgent messages can be sent to your provider as well.   ?To learn more about what you can do with MyChart, go to [email or web address].   ? ?Your next appointment:   ?12 month(s) ? ?The format for your next appointment:   ?In Person ? ?Provider:   ?Eleonore Chiquito, MD  ? ? ?

## 2021-05-10 DIAGNOSIS — Z96611 Presence of right artificial shoulder joint: Secondary | ICD-10-CM | POA: Diagnosis not present

## 2021-05-10 DIAGNOSIS — M25611 Stiffness of right shoulder, not elsewhere classified: Secondary | ICD-10-CM | POA: Diagnosis not present

## 2021-05-11 DIAGNOSIS — Z9889 Other specified postprocedural states: Secondary | ICD-10-CM | POA: Diagnosis not present

## 2021-05-11 DIAGNOSIS — S46011A Strain of muscle(s) and tendon(s) of the rotator cuff of right shoulder, initial encounter: Secondary | ICD-10-CM | POA: Diagnosis not present

## 2021-05-12 DIAGNOSIS — Z125 Encounter for screening for malignant neoplasm of prostate: Secondary | ICD-10-CM | POA: Diagnosis not present

## 2021-05-12 DIAGNOSIS — I358 Other nonrheumatic aortic valve disorders: Secondary | ICD-10-CM | POA: Diagnosis not present

## 2021-05-12 DIAGNOSIS — D563 Thalassemia minor: Secondary | ICD-10-CM | POA: Diagnosis not present

## 2021-05-12 DIAGNOSIS — Z800 Family history of malignant neoplasm of digestive organs: Secondary | ICD-10-CM | POA: Diagnosis not present

## 2021-05-12 DIAGNOSIS — E7800 Pure hypercholesterolemia, unspecified: Secondary | ICD-10-CM | POA: Diagnosis not present

## 2021-05-12 DIAGNOSIS — D126 Benign neoplasm of colon, unspecified: Secondary | ICD-10-CM | POA: Diagnosis not present

## 2021-05-26 ENCOUNTER — Other Ambulatory Visit: Payer: Self-pay | Admitting: Cardiovascular Disease

## 2021-09-12 DIAGNOSIS — S62234A Other nondisplaced fracture of base of first metacarpal bone, right hand, initial encounter for closed fracture: Secondary | ICD-10-CM | POA: Diagnosis not present

## 2021-09-22 DIAGNOSIS — S62234D Other nondisplaced fracture of base of first metacarpal bone, right hand, subsequent encounter for fracture with routine healing: Secondary | ICD-10-CM | POA: Diagnosis not present

## 2021-10-19 DIAGNOSIS — S62234D Other nondisplaced fracture of base of first metacarpal bone, right hand, subsequent encounter for fracture with routine healing: Secondary | ICD-10-CM | POA: Diagnosis not present

## 2021-11-15 DIAGNOSIS — M546 Pain in thoracic spine: Secondary | ICD-10-CM | POA: Diagnosis not present

## 2021-11-15 DIAGNOSIS — M9903 Segmental and somatic dysfunction of lumbar region: Secondary | ICD-10-CM | POA: Diagnosis not present

## 2021-11-15 DIAGNOSIS — M62838 Other muscle spasm: Secondary | ICD-10-CM | POA: Diagnosis not present

## 2021-11-15 DIAGNOSIS — M62830 Muscle spasm of back: Secondary | ICD-10-CM | POA: Diagnosis not present

## 2021-11-15 DIAGNOSIS — M9901 Segmental and somatic dysfunction of cervical region: Secondary | ICD-10-CM | POA: Diagnosis not present

## 2021-11-15 DIAGNOSIS — M545 Low back pain, unspecified: Secondary | ICD-10-CM | POA: Diagnosis not present

## 2021-11-15 DIAGNOSIS — M542 Cervicalgia: Secondary | ICD-10-CM | POA: Diagnosis not present

## 2021-11-15 DIAGNOSIS — M9902 Segmental and somatic dysfunction of thoracic region: Secondary | ICD-10-CM | POA: Diagnosis not present

## 2021-11-16 DIAGNOSIS — M9901 Segmental and somatic dysfunction of cervical region: Secondary | ICD-10-CM | POA: Diagnosis not present

## 2021-11-16 DIAGNOSIS — M545 Low back pain, unspecified: Secondary | ICD-10-CM | POA: Diagnosis not present

## 2021-11-16 DIAGNOSIS — M9902 Segmental and somatic dysfunction of thoracic region: Secondary | ICD-10-CM | POA: Diagnosis not present

## 2021-11-16 DIAGNOSIS — M546 Pain in thoracic spine: Secondary | ICD-10-CM | POA: Diagnosis not present

## 2021-11-16 DIAGNOSIS — M62838 Other muscle spasm: Secondary | ICD-10-CM | POA: Diagnosis not present

## 2021-11-16 DIAGNOSIS — M62830 Muscle spasm of back: Secondary | ICD-10-CM | POA: Diagnosis not present

## 2021-11-16 DIAGNOSIS — M9903 Segmental and somatic dysfunction of lumbar region: Secondary | ICD-10-CM | POA: Diagnosis not present

## 2021-11-16 DIAGNOSIS — M542 Cervicalgia: Secondary | ICD-10-CM | POA: Diagnosis not present

## 2021-12-06 DIAGNOSIS — M79641 Pain in right hand: Secondary | ICD-10-CM | POA: Diagnosis not present

## 2021-12-06 DIAGNOSIS — M5442 Lumbago with sciatica, left side: Secondary | ICD-10-CM | POA: Diagnosis not present

## 2021-12-27 DIAGNOSIS — M5442 Lumbago with sciatica, left side: Secondary | ICD-10-CM | POA: Diagnosis not present

## 2022-01-25 DIAGNOSIS — R059 Cough, unspecified: Secondary | ICD-10-CM | POA: Diagnosis not present

## 2022-01-25 DIAGNOSIS — R5383 Other fatigue: Secondary | ICD-10-CM | POA: Diagnosis not present

## 2022-01-25 DIAGNOSIS — B9689 Other specified bacterial agents as the cause of diseases classified elsewhere: Secondary | ICD-10-CM | POA: Diagnosis not present

## 2022-01-25 DIAGNOSIS — R0981 Nasal congestion: Secondary | ICD-10-CM | POA: Diagnosis not present

## 2022-01-25 DIAGNOSIS — R52 Pain, unspecified: Secondary | ICD-10-CM | POA: Diagnosis not present

## 2022-02-04 ENCOUNTER — Other Ambulatory Visit: Payer: Self-pay | Admitting: Cardiovascular Disease

## 2022-02-16 DIAGNOSIS — R7309 Other abnormal glucose: Secondary | ICD-10-CM | POA: Diagnosis not present

## 2022-02-16 DIAGNOSIS — E7800 Pure hypercholesterolemia, unspecified: Secondary | ICD-10-CM | POA: Diagnosis not present

## 2022-02-16 DIAGNOSIS — R195 Other fecal abnormalities: Secondary | ICD-10-CM | POA: Diagnosis not present

## 2022-02-16 DIAGNOSIS — Z125 Encounter for screening for malignant neoplasm of prostate: Secondary | ICD-10-CM | POA: Diagnosis not present

## 2022-03-22 ENCOUNTER — Encounter: Payer: Self-pay | Admitting: Cardiovascular Disease

## 2022-03-22 ENCOUNTER — Telehealth: Payer: Self-pay | Admitting: Cardiovascular Disease

## 2022-03-22 NOTE — Telephone Encounter (Signed)
LMOM for patient to return call.   Only A1c in system 02/2022 - 6.3  LDL currently at 60 (02/222) on rosuvastatin 20 and ezetimibe 10.   ASCVD = CAC 676

## 2022-03-22 NOTE — Telephone Encounter (Signed)
Pt c/o medication issue:  1. Name of Medication:   rosuvastatin (CRESTOR) 20 MG tablet  ezetimibe (ZETIA) 10 MG tablet   2. How are you currently taking this medication (dosage and times per day)?   As prescribed  3. Are you having a reaction (difficulty breathing--STAT)?   4. What is your medication issue?   Patient stated he is experiencing elevated A1C and his fasting glucose has been reading high.  Patient would like alternate medication.

## 2022-03-24 ENCOUNTER — Ambulatory Visit: Payer: PPO | Admitting: Nurse Practitioner

## 2022-03-27 NOTE — Telephone Encounter (Signed)
Attempted to contact patient to discuss visit.   LVM to call back, left call back number.

## 2022-03-27 NOTE — Telephone Encounter (Signed)
I attempted to contact for a visit, LVM to call back.  Left call back number.

## 2022-03-31 NOTE — Telephone Encounter (Signed)
Patient scheduled for 02/26 with Dr.O'Neal- aware via mychart.   Thank you!

## 2022-04-09 NOTE — Progress Notes (Unsigned)
Cardiology Office Note:   Date:  04/10/2022  NAME:  Luis Robles    MRN: [id number] DOB:  Mar 30, 1945   PCP:  Pcp, No  Cardiologist:  None  Electrophysiologist:  None   Referring MD: No ref. provider found   Chief Complaint  Patient presents with   Hyperlipidemia   History of Present Illness:   Luis Robles is a 77 y.o. male with a hx of coronary calcium who presents for follow-up.  He reports his A1 C is increased to 6.3.  LDL cholesterol 60.  His diet has really not changed.  He is still exercising twice per day.  He is doing lots of swimming and aerobic activity.  No chest pains or trouble breathing.  He can exercise for up to 1 hour without limitations.  It is bothering him that his A1c has increased to 6.3.  I really have no other explanation besides statin therapy.  He is interesting in transitioning to a PCSK9 inhibitor.  I do agree.  He is on Zetia.  Denies any chest pain or trouble breathing in office.  He also describes some cramping in his left leg.  Reports burning sensation and numbness.  Could be related to Crestor as well.  We discussed going off Crestor and going to [place].   Problem List 1. CAD  -CAC score 676 (72nd percentile) 2. HLD -T chol 133, HDL 61, LDL 60, TG 51  Past Medical History: Past Medical History:  Diagnosis Date   Anemia    hgb 11 in 2016   Aortic sclerosis    Coronary artery disease    Hypercholesteremia    Perirectal abscess     Past Surgical History: Past Surgical History:  Procedure Laterality Date   COLONOSCOPY     NO PAST SURGERIES     REVERSE SHOULDER ARTHROPLASTY Right 02/24/2021   Procedure: RIGHT REVERSE SHOULDER ARTHROPLASTY;  Surgeon: Tania Ade, MD;  Location: [place];  Service: Orthopedics;  Laterality: Right;    Current Medications: Current Meds  Medication Sig   cyanocobalamin 500 MCG tablet Take 500 mcg by mouth every other day.   ezetimibe (ZETIA) 10 MG tablet TAKE ONE TABLET BY MOUTH ONE TIME DAILY    metFORMIN (GLUCOPHAGE) 850 MG tablet Take 850 mg by mouth 2 (two) times daily.   Omega-3 Fatty Acids (FISH OIL) 1000 MG CAPS Take 1,000 mg by mouth 2 (two) times daily.   [DISCONTINUED] methocarbamol (ROBAXIN) 500 MG tablet Take 1 tablet (500 mg total) by mouth every 8 (eight) hours as needed for muscle spasms.   [DISCONTINUED] rosuvastatin (CRESTOR) 20 MG tablet TAKE ONE TABLET BY MOUTH ONE TIME DAILY     Allergies:    Patient has no known allergies.   Social History: Social History   Socioeconomic History   Marital status: Married    Spouse name: Not on file   Number of children: Not on file   Years of education: Not on file   Highest education level: Not on file  Occupational History   Occupation: retired Marine scientist anesthesia  Tobacco Use   Smoking status: Never   Smokeless tobacco: Never  Vaping Use   Vaping Use: Never used  Substance and Sexual Activity   Alcohol use: No   Drug use: No   Sexual activity: Not on file  Other Topics Concern   Not on file  Social History Narrative   Not on file   Social Determinants of Health   Financial Resource Strain: Not on  file  Food Insecurity: Not on file  Transportation Needs: Not on file  Physical Activity: Not on file  Stress: Not on file  Social Connections: Not on file     Family History: The patient's family history includes Colon cancer in his father.  ROS:   All other ROS reviewed and negative. Pertinent positives noted in the HPI.     EKGs/Labs/Other Studies Reviewed:   The following studies were personally reviewed by me today:  Recent Labs: No results found for requested labs within last 365 days.   Recent Lipid Panel    Component Value Date/Time   CHOL 140 02/16/2020 1616   TRIG 67 02/16/2020 1616   HDL 71 02/16/2020 1616   CHOLHDL 2.0 02/16/2020 1616   LDLCALC 55 02/16/2020 1616    Physical Exam:   VS:  BP 130/64   Pulse (!) 51   Ht '5\' 8"'$  (1.727 m)   Wt 145 lb 12.8 oz (66.1 kg)   SpO2 100%   BMI  22.17 kg/m    Wt Readings from Last 3 Encounters:  04/10/22 145 lb 12.8 oz (66.1 kg)  05/04/21 154 lb (69.9 kg)  02/24/21 143 lb (64.9 kg)    General: Well nourished, well developed, in no acute distress Head: Atraumatic, normal size  Eyes: PEERLA, EOMI  Neck: Supple, no JVD Endocrine: No thryomegaly Cardiac: Normal S1, S2; RRR; no murmurs, rubs, or gallops Lungs: Clear to auscultation bilaterally, no wheezing, rhonchi or rales  Abd: Soft, nontender, no hepatomegaly  Ext: No edema, pulses 2+ Musculoskeletal: No deformities, BUE and BLE strength normal and equal Skin: Warm and dry, no rashes   Neuro: Alert and oriented to person, place, time, and situation, CNII-XII grossly intact, no focal deficits  Psych: Normal mood and affect   ASSESSMENT:   Luis Robles is a 77 y.o. male who presents for the following: 1. Agatston coronary artery calcium score greater than 400   2. Mixed hyperlipidemia     PLAN:   1. Agatston coronary artery calcium score greater than 400 2. Mixed hyperlipidemia -Elevated coronary calcium score.  Was well-controlled on Crestor 20 mg daily and Zetia.  A1c has increased to 6.3.  LDL 60.  His values are not at goal despite being on Crestor and Zetia.  He also reports some numbness in his left leg.  This could be related to Crestor.  I have recommended to transition to PCSK9 inhibitor.  He will continue Zetia.  Could come off Crestor.  We will set him up for pharmacy visit to discuss further.  No symptoms of angina.  Overall doing well.  We will continue with aggressive secondary prevention.  Disposition: Return in about 1 year (around 04/11/2023).  Medication Adjustments/Labs and Tests Ordered: Current medicines are reviewed at length with the patient today.  Concerns regarding medicines are outlined above.  Orders Placed This Encounter  Procedures   AMB Referral to [facility]   No orders of the defined types were placed in this  encounter.   Patient Instructions  Medication Instructions:  STOP Crestor   *If you need a refill on your cardiac medications before your next appointment, please call your pharmacy*   Follow-Up: At [facility], you and your health needs are our priority.  As part of our continuing mission to provide you with exceptional heart care, we have created designated Provider Care Teams.  These Care Teams include your primary Cardiologist (physician) and Advanced Practice Providers (APPs -  Physician Assistants  and Nurse Practitioners) who all work together to provide you with the care you need, when you need it.  We recommend signing up for the patient portal called "MyChart".  Sign up information is provided on this After Visit Summary.  MyChart is used to connect with patients for Virtual Visits (Telemedicine).  Patients are able to view lab/test results, encounter notes, upcoming appointments, etc.  Non-urgent messages can be sent to your provider as well.   To learn more about what you can do with MyChart, go to [email or web address].    Your next appointment:   12 month(s)  Provider:   Eleonore Chiquito, MD  Other Instructions  Our PHARMACY team will reach out for an appointment.      Time Spent with Patient: I have spent a total of 25 minutes with patient reviewing hospital notes, telemetry, EKGs, labs and examining the patient as well as establishing an assessment and plan that was discussed with the patient.  > 50% of time was spent in direct patient care.  Signed, Addison Naegeli. Audie Box, MD, [facility]  [address] [place], [place] [address] [phone]  04/10/2022 3:39 PM

## 2022-04-10 ENCOUNTER — Ambulatory Visit: Payer: PPO | Attending: Cardiovascular Disease | Admitting: Cardiovascular Disease

## 2022-04-10 ENCOUNTER — Encounter: Payer: Self-pay | Admitting: Cardiovascular Disease

## 2022-04-10 VITALS — BP 130/64 | HR 51 | Ht 68.0 in | Wt 145.8 lb

## 2022-04-10 DIAGNOSIS — R931 Abnormal findings on diagnostic imaging of heart and coronary circulation: Secondary | ICD-10-CM

## 2022-04-10 DIAGNOSIS — E782 Mixed hyperlipidemia: Secondary | ICD-10-CM | POA: Diagnosis not present

## 2022-04-10 NOTE — Patient Instructions (Signed)
Medication Instructions:  STOP Crestor   *If you need a refill on your cardiac medications before your next appointment, please call your pharmacy*   Follow-Up: At [facility], you and your health needs are our priority.  As part of our continuing mission to provide you with exceptional heart care, we have created designated Provider Care Teams.  These Care Teams include your primary Cardiologist (physician) and Advanced Practice Providers (APPs -  Physician Assistants and Nurse Practitioners) who all work together to provide you with the care you need, when you need it.  We recommend signing up for the patient portal called "MyChart".  Sign up information is provided on this After Visit Summary.  MyChart is used to connect with patients for Virtual Visits (Telemedicine).  Patients are able to view lab/test results, encounter notes, upcoming appointments, etc.  Non-urgent messages can be sent to your provider as well.   To learn more about what you can do with MyChart, go to [email or web address].    Your next appointment:   12 month(s)  Provider:   Eleonore Chiquito, MD  Other Instructions  Our PHARMACY team will reach out for an appointment.

## 2022-04-14 ENCOUNTER — Ambulatory Visit: Payer: PPO | Admitting: Cardiovascular Disease

## 2022-04-14 ENCOUNTER — Encounter: Payer: Self-pay | Admitting: Student

## 2022-04-14 ENCOUNTER — Other Ambulatory Visit (HOSPITAL_COMMUNITY): Payer: Self-pay

## 2022-04-14 ENCOUNTER — Ambulatory Visit: Payer: PPO | Attending: Cardiology | Admitting: Student

## 2022-04-14 ENCOUNTER — Telehealth: Payer: Self-pay | Admitting: Pharmacist

## 2022-04-14 DIAGNOSIS — E7800 Pure hypercholesterolemia, unspecified: Secondary | ICD-10-CM | POA: Diagnosis not present

## 2022-04-14 NOTE — Assessment & Plan Note (Signed)
Assessment:  LDL goal: < 70 mg/dl last LDLc 60  mg/dl (Jan/2024) Tolerates Zetia well without any side effects  Intolerance to high intensity statins BG elevation and muscle spasm  Patient has stopped taking Crestor 20 mg from last 2 weeks noticed improvement in spasms and BG  Discussed next potential options (PCSK-9 inhibitors, bempedoic acid and inclisiran); cost, dosing efficacy, side effects    Plan: Continue taking current medications ezetimibe 10 mg  Patient prefer to go on PCSK9i  Will apply for PA for PCSK9i; will inform patient upon approval (prefers MyChart message) Lipid lab due in 2-3 months after starting [facility]

## 2022-04-14 NOTE — Progress Notes (Unsigned)
Patient ID: JASSIEM TAMBE                 DOB: 1945-08-25                    MRN: [id number]      HPI: Luis Robles is a 77 y.o. male patient referred to lipid clinic by Dr.O'Neal. PMH is significant for elevated CAC score, CAD,prediabetes.   Patient presented today for cholesterol medication optimization. Reports since he found out that he is prediabetic he started eating only one meal per day and does exercise everyday morning and evening. He hs been taking rosuvastatin for sometime now but he had noticed lately it started getting muscle spasms which is affecting his exercise capabilities. Also he had noticed since he is on statins his FBG remains in 110 to 150 range which use to stay below 100 mg/dl. He strongly believes statin is affecting his bG level and per him his PCP agrees to that too. From last 2 weeks he has stop taking rosuvastatin 20 mg and his FBG has improved and  leg cramps are gone.    Diet:   Only eats one meal - cut down carbohydrate to 25 gm per day  Fish,chicken, greens  No snacks  Only drinks water   Exercise:  cardio plus weight resistance in the morning and swimming in the evening for 30 min everyday  Family History:  mom- died in her 87's had hypertension  Dad: colon cancer    Social History:  Tobacco use: never Alcohol: occasional   Labs: Lipid Panel     Component Value Date/Time   CHOL 140 02/16/2020 1616   TRIG 67 02/16/2020 1616   HDL 71 02/16/2020 1616   CHOLHDL 2.0 02/16/2020 1616   LDLCALC 55 02/16/2020 1616   LABVLDL 14 02/16/2020 1616    Past Medical History:  Diagnosis Date   Anemia    hgb 11 in 2016   Aortic sclerosis    Coronary artery disease    Hypercholesteremia    Perirectal abscess     Current Outpatient Medications on File Prior to Visit  Medication Sig Dispense Refill   cyanocobalamin 500 MCG tablet Take 500 mcg by mouth every other day.     ezetimibe (ZETIA) 10 MG tablet TAKE ONE TABLET BY MOUTH ONE TIME DAILY  90 tablet 3   metFORMIN (GLUCOPHAGE) 850 MG tablet Take 850 mg by mouth 2 (two) times daily.     Omega-3 Fatty Acids (FISH OIL) 1000 MG CAPS Take 1,000 mg by mouth 2 (two) times daily.     No current facility-administered medications on file prior to visit.    No Known Allergies  Assessment/Plan:  1. Hyperlipidemia -  Problem  Hypercholesteremia    Current Medications: ezetimibe 10 mg daily, fish oil 4 gm per day  Intolerances: Crestor 20 mg - muscle spasm  Risk Factors: CAD, CAC score 676 LDL goal: <70 mg/dl Recent lab TC: 133, HDL, 61, LDL 60 TG 51 (Jan 2024)    Hypercholesteremia Assessment:  LDL goal: < 70 mg/dl last LDLc 60  mg/dl (Jan/2024) Tolerates Zetia well without any side effects  Intolerance to high intensity statins BG elevation and muscle spasm  Patient has stopped taking Crestor 20 mg from last 2 weeks noticed improvement in spasms and BG  Discussed next potential options (PCSK-9 inhibitors, bempedoic acid and inclisiran); cost, dosing efficacy, side effects    Plan: Continue taking current medications ezetimibe 10 mg  Patient prefer to go on PCSK9i  Will apply for PA for PCSK9i; will inform patient upon approval (prefers MyChart message) Lipid lab due in 2-3 months after starting PCSK9i    Thank you,  Cammy Copa, Pharm.D [facility] of [facility] [address], [place], [place] [address]  Phone: [phone]; Fax: [phone]

## 2022-04-14 NOTE — Telephone Encounter (Signed)
  PA form faxed to [facility]

## 2022-04-14 NOTE — Patient Instructions (Signed)
   Medication changes: We will start the process to get PCSK9i (Repatha and Praluent)  covered by your insurance.  Once the prior authorization is complete, we will call you to let you know and confirm pharmacy information.      Praluent is a cholesterol medication that improved your body's ability to get rid of "bad cholesterol" known as LDL. It can lower your LDL up to 60%. It is an injection that is given under the skin every 2 weeks. The most common side effects of Praluent include runny nose, symptoms of the common cold, rarely flu or flu-like symptoms, back/muscle pain in about 3-4% of the patients, and redness, pain, or bruising at the injection site.    Repatha is a cholesterol medication that improved your body's ability to get rid of "bad cholesterol" known as LDL. It can lower your LDL up to 60%! It is an injection that is given under the skin every 2 weeks. The most common side effects of Repatha include runny nose, symptoms of the common cold, rarely flu or flu-like symptoms, back/muscle pain in about 3-4% of the patients, and redness, pain, or bruising at the injection site.   Lab orders: We want to repeat labs after 2-3 months.  We will send you a lab order to remind you once we get closer to that time.

## 2022-04-17 ENCOUNTER — Telehealth: Payer: Self-pay

## 2022-04-17 DIAGNOSIS — E782 Mixed hyperlipidemia: Secondary | ICD-10-CM

## 2022-04-17 DIAGNOSIS — E7800 Pure hypercholesterolemia, unspecified: Secondary | ICD-10-CM

## 2022-04-17 MED ORDER — REPATHA SURECLICK 140 MG/ML ~~LOC~~ SOAJ: 140.0000 mg | SUBCUTANEOUS | 3 refills | Status: DC

## 2022-04-17 NOTE — Addendum Note (Signed)
Addended by: Anda Latina on: 04/17/2022 04:36 PM   Modules accepted: Orders

## 2022-04-17 NOTE — Telephone Encounter (Signed)
Pharmacy Patient Advocate Encounter  Prior Authorization for [facility] has been approved.    Effective dates: 04/14/22 through 10/15/22  Karie Soda, [place] Patient Advocate Specialist Direct Number: [phone] Fax: [phone]

## 2022-05-02 ENCOUNTER — Other Ambulatory Visit (HOSPITAL_COMMUNITY): Payer: Self-pay

## 2022-05-02 NOTE — Telephone Encounter (Signed)
They never did fax determination but per test claim it was approved.  Patient filled 04/17/22 and they will pay again 06/19/22

## 2022-09-11 DIAGNOSIS — E7800 Pure hypercholesterolemia, unspecified: Secondary | ICD-10-CM | POA: Diagnosis not present

## 2022-09-11 DIAGNOSIS — D563 Thalassemia minor: Secondary | ICD-10-CM | POA: Diagnosis not present

## 2022-09-11 DIAGNOSIS — Z0000 Encounter for general adult medical examination without abnormal findings: Secondary | ICD-10-CM | POA: Diagnosis not present

## 2022-09-11 DIAGNOSIS — R7303 Prediabetes: Secondary | ICD-10-CM | POA: Diagnosis not present

## 2022-09-11 DIAGNOSIS — R972 Elevated prostate specific antigen [PSA]: Secondary | ICD-10-CM | POA: Diagnosis not present

## 2022-10-11 DIAGNOSIS — H31002 Unspecified chorioretinal scars, left eye: Secondary | ICD-10-CM | POA: Diagnosis not present

## 2022-10-11 DIAGNOSIS — E119 Type 2 diabetes mellitus without complications: Secondary | ICD-10-CM | POA: Diagnosis not present

## 2022-10-11 DIAGNOSIS — H5203 Hypermetropia, bilateral: Secondary | ICD-10-CM | POA: Diagnosis not present

## 2022-11-29 DIAGNOSIS — R0683 Snoring: Secondary | ICD-10-CM | POA: Diagnosis not present

## 2022-12-05 ENCOUNTER — Encounter: Payer: Self-pay | Admitting: Cardiovascular Disease

## 2022-12-06 ENCOUNTER — Other Ambulatory Visit (HOSPITAL_COMMUNITY): Payer: Self-pay

## 2022-12-06 ENCOUNTER — Telehealth: Payer: Self-pay | Admitting: Pharmacy Technician

## 2022-12-06 NOTE — Telephone Encounter (Signed)
-----   Message from [place] sent at 12/06/2022  9:00 AM EDT ----- Repatha PA renewal per pt request, thanks!

## 2022-12-06 NOTE — Telephone Encounter (Signed)
Pharmacy Patient Advocate Encounter   Received notification from Pt Calls Messages that prior authorization for repatha is required/requested.   Insurance verification completed.   The patient is insured through [facility] ADVANTAGE/RX ADVANCE .   Per test claim: PA required; PA submitted to [facility] ADVANTAGE/RX ADVANCE via CoverMyMeds Key/confirmation #/EOC [id number] Status is pending

## 2022-12-07 NOTE — Telephone Encounter (Signed)
Pharmacy Patient Advocate Encounter  Received notification from [facility] ADVANTAGE/RX ADVANCE that Prior Authorization for repatha has been DENIED.  Full denial letter will be uploaded to the media tab. See denial reason below.   PA #/Case ID/Reference #: [id number]

## 2022-12-12 ENCOUNTER — Other Ambulatory Visit (HOSPITAL_COMMUNITY): Payer: Self-pay

## 2022-12-12 MED ORDER — REPATHA SURECLICK 140 MG/ML ~~LOC~~ SOAJ
140.0000 mg | SUBCUTANEOUS | 5 refills | Status: DC
  Filled 2022-12-12: qty 2, 28d supply, fill #0

## 2022-12-12 MED ORDER — REPATHA SURECLICK 140 MG/ML ~~LOC~~ SOAJ
140.0000 mg | SUBCUTANEOUS | 5 refills | Status: DC
  Filled 2022-12-12 – 2023-01-02 (×3): qty 2, 28d supply, fill #0

## 2022-12-12 NOTE — Telephone Encounter (Signed)
Pt has been notified of approval via mychart message.

## 2022-12-12 NOTE — Addendum Note (Signed)
Addended by: Cheree Ditto on: 12/12/2022 12:33 PM   Modules accepted: Orders

## 2022-12-12 NOTE — Telephone Encounter (Signed)
Prior authorization approved for Repatha

## 2022-12-14 DIAGNOSIS — G4733 Obstructive sleep apnea (adult) (pediatric): Secondary | ICD-10-CM | POA: Diagnosis not present

## 2022-12-14 DIAGNOSIS — G478 Other sleep disorders: Secondary | ICD-10-CM | POA: Diagnosis not present

## 2022-12-22 ENCOUNTER — Other Ambulatory Visit (HOSPITAL_COMMUNITY): Payer: Self-pay

## 2023-01-02 ENCOUNTER — Other Ambulatory Visit (HOSPITAL_COMMUNITY): Payer: Self-pay

## 2023-02-04 ENCOUNTER — Other Ambulatory Visit: Payer: Self-pay | Admitting: Cardiovascular Disease

## 2023-03-31 ENCOUNTER — Other Ambulatory Visit: Payer: Self-pay | Admitting: Cardiovascular Disease

## 2023-04-05 ENCOUNTER — Encounter: Payer: Self-pay | Admitting: Cardiovascular Disease

## 2023-04-05 MED ORDER — REPATHA SURECLICK 140 MG/ML ~~LOC~~ SOAJ: 140.0000 mg | SUBCUTANEOUS | 5 refills | Status: DC

## 2023-04-07 ENCOUNTER — Encounter: Payer: Self-pay | Admitting: Cardiovascular Disease

## 2023-04-09 MED ORDER — REPATHA SURECLICK 140 MG/ML ~~LOC~~ SOAJ: 140.0000 mg | SUBCUTANEOUS | 3 refills | Status: AC

## 2023-04-09 NOTE — Addendum Note (Signed)
 Addended by: Jeannette How A on: 04/09/2023 04:17 PM   Modules accepted: Orders

## 2023-04-09 NOTE — Telephone Encounter (Signed)
 New refill on repatha sent as requested to [facility].

## 2023-05-10 DIAGNOSIS — M654 Radial styloid tenosynovitis [de Quervain]: Secondary | ICD-10-CM | POA: Diagnosis not present

## 2023-05-20 NOTE — Progress Notes (Unsigned)
 Cardiology Office Note:  .   Date:  05/21/2023  ID:  Luis Robles, DOB April 09, 1945, MRN [id number] PCP: Pcp, No  [facility] Providers Cardiologist:  None { History of Present Illness: .    Chief Complaint  Patient presents with   Follow-up         Luis Robles is a 78 y.o. male with history of CAC, HLD who presents for follow-up.    History of Present Illness   Luis Robles is a 78 year old male with elevated coronary calcium score who presents for follow-up.  He has an elevated coronary calcium score of 676, placing him in the 72nd percentile. He is currently asymptomatic with no chest pain or dyspnea. He maintains a high level of physical activity, which is beneficial for his cardiovascular health. His lipid management is effective with the use of Repatha and ezetimibe. His most recent lipid panel from last year showed an LDL of 38, HDL of 70, triglycerides of 58, and total cholesterol of 110. No chest pain or trouble breathing during his activities.  He engages in regular physical exercise, including swimming, gym workouts, and a routine of 400 jumping jacks in sets of 100, repeated four times. He notes significant improvement in his heart rate recovery, with his heart rate returning to 70 from 130 within a minute.  He has a history of age-related type 2 prediabetes and is on metformin. He uses a continuous glucose monitor, which currently shows a fasting blood sugar level of 111 after 14 hours. He is mindful of his diet, aiming to reduce carbohydrate and sugar intake to prevent significant blood sugar spikes.          Problem List 1. CAD  -CAC score 676 (72nd percentile) 2. HLD -T chol 133, HDL 61, LDL 60, TG 51    ROS: All other ROS reviewed and negative. Pertinent positives noted in the HPI.     Studies Reviewed: Marland Kitchen   EKG Interpretation Date/Time:  Monday May 21 2023 14:08:15 EDT Ventricular Rate:  49 PR Interval:  224 QRS Duration:  90 QT  Interval:  434 QTC Calculation: 392 R Axis:   63  Text Interpretation: Sinus bradycardia with 1st degree A-V block Minimal voltage criteria for LVH, may be normal variant ( Sokolow-Lyon ) Confirmed by Lennie Odor [phone]) on 05/21/2023 2:13:34 PM    CAC 12/11/2019 IMPRESSION: Coronary calcium score of 676. This was 72nd percentile for age and sex matched controls. Physical Exam:   VS:  BP 100/60   Pulse (!) 49   Ht 5\' 8"  (1.727 m)   Wt 143 lb (64.9 kg)   SpO2 98%   BMI 21.74 kg/m    Wt Readings from Last 3 Encounters:  05/21/23 143 lb (64.9 kg)  04/10/22 145 lb 12.8 oz (66.1 kg)  05/04/21 154 lb (69.9 kg)    GEN: Well nourished, well developed in no acute distress NECK: No JVD; No carotid bruits CARDIAC: RRR, no murmurs, rubs, gallops RESPIRATORY:  Clear to auscultation without rales, wheezing or rhonchi  ABDOMEN: Soft, non-tender, non-distended EXTREMITIES:  No edema; No deformity  ASSESSMENT AND PLAN: .   Assessment and Plan    Elevated coronary calcium score HLD Coronary calcium score of 676, 72nd percentile. Asymptomatic, high physical activity. LDL at 60 mg/dL with Repatha and ezetimibe. Sinus bradycardia on EKG. Focus on prevention due to risk profile. - Continue Repatha and ezetimibe. - Order lipid panel and Lp(a) test. - Schedule follow-up  in one year.  Type 2 prediabetes Age-related type 2 prediabetes managed with metformin and lifestyle changes. Fasting glucose 111 mg/dL. Effective dietary management and glucose monitoring. - Continue metformin. - Encourage continued use of continuous glucose monitor. - Advise on maintaining dietary modifications to manage blood glucose levels. - A1c today              Follow-up: Return in about 1 year (around 05/20/2024).   Signed, Lenna Gilford. Flora Lipps, MD, [facility]  [facility]  [address] [place], [place] [address] [phone]  2:28 PM

## 2023-05-21 ENCOUNTER — Encounter: Payer: Self-pay | Admitting: Cardiovascular Disease

## 2023-05-21 ENCOUNTER — Ambulatory Visit: Payer: PPO | Attending: Cardiovascular Disease | Admitting: Cardiovascular Disease

## 2023-05-21 VITALS — BP 100/60 | HR 49 | Ht 68.0 in | Wt 143.0 lb

## 2023-05-21 DIAGNOSIS — R7303 Prediabetes: Secondary | ICD-10-CM | POA: Diagnosis not present

## 2023-05-21 DIAGNOSIS — R931 Abnormal findings on diagnostic imaging of heart and coronary circulation: Secondary | ICD-10-CM

## 2023-05-21 DIAGNOSIS — E782 Mixed hyperlipidemia: Secondary | ICD-10-CM | POA: Diagnosis not present

## 2023-05-21 NOTE — Patient Instructions (Addendum)
 Medication Instructions:  Your physician recommends that you continue on your current medications as directed. Please refer to the Current Medication list given to you today.    *If you need a refill on your cardiac medications before your next appointment, please call your pharmacy*   Lab Work: LIPID PANEL  Lipoprotein a  HGA1C   If you have labs (blood work) drawn today and your tests are completely normal, you will receive your results only by: MyChart Message (if you have MyChart) OR A paper copy in the mail If you have any lab test that is abnormal or we need to change your treatment, we will call you to review the results.   Testing/Procedures: None    Follow-Up: At [facility], you and your health needs are our priority.  As part of our continuing mission to provide you with exceptional heart care, we have created designated Provider Care Teams.  These Care Teams include your primary Cardiologist (physician) and Advanced Practice Providers (APPs -  Physician Assistants and Nurse Practitioners) who all work together to provide you with the care you need, when you need it.  We recommend signing up for the patient portal called "MyChart".  Sign up information is provided on this After Visit Summary.  MyChart is used to connect with patients for Virtual Visits (Telemedicine).  Patients are able to view lab/test results, encounter notes, upcoming appointments, etc.  Non-urgent messages can be sent to your provider as well.   To learn more about what you can do with MyChart, go to [email or web address].    Your next appointment:   1 year(s)  The format for your next appointment:   In Person  Provider:   Edd Fabian, FNP, Micah Flesher, PA-C, Marjie Skiff, PA-C, Robet Leu, PA-C, Juanda Crumble, PA-C, Joni Reining, DNP, ANP, Azalee Course, PA-C, Bernadene Person, NP, or Reather Littler, NP       Other Instructions

## 2023-05-22 ENCOUNTER — Encounter: Payer: Self-pay | Admitting: Cardiovascular Disease

## 2023-05-22 LAB — LIPID PANEL
Chol/HDL Ratio: 1.8 ratio (ref 0.0–5.0)
Cholesterol, Total: 147 mg/dL (ref 100–199)
HDL: 81 mg/dL (ref >39)
LDL Chol Calc (NIH): 53 mg/dL (ref 0–99)
Triglycerides: 61 mg/dL (ref 0–149)
VLDL Cholesterol Cal: 13 mg/dL (ref 5–40)

## 2023-05-22 LAB — HEMOGLOBIN A1C
Est. average glucose Bld gHb Est-mCnc: 123 mg/dL
Hgb A1c MFr Bld: 5.9 % — ABNORMAL HIGH (ref 4.8–5.6)

## 2023-05-22 LAB — LIPOPROTEIN A (LPA): Lipoprotein (a): 24 nmol/L (ref <75.0)

## 2023-09-10 DIAGNOSIS — R7303 Prediabetes: Secondary | ICD-10-CM | POA: Diagnosis not present

## 2023-09-10 DIAGNOSIS — Z125 Encounter for screening for malignant neoplasm of prostate: Secondary | ICD-10-CM | POA: Diagnosis not present

## 2023-09-10 DIAGNOSIS — D563 Thalassemia minor: Secondary | ICD-10-CM | POA: Diagnosis not present

## 2023-09-10 DIAGNOSIS — E7800 Pure hypercholesterolemia, unspecified: Secondary | ICD-10-CM | POA: Diagnosis not present

## 2023-09-12 DIAGNOSIS — Z0000 Encounter for general adult medical examination without abnormal findings: Secondary | ICD-10-CM | POA: Diagnosis not present

## 2023-09-12 DIAGNOSIS — E7800 Pure hypercholesterolemia, unspecified: Secondary | ICD-10-CM | POA: Diagnosis not present

## 2023-09-12 DIAGNOSIS — Z0189 Encounter for other specified special examinations: Secondary | ICD-10-CM | POA: Diagnosis not present

## 2023-09-12 DIAGNOSIS — D563 Thalassemia minor: Secondary | ICD-10-CM | POA: Diagnosis not present

## 2023-09-12 DIAGNOSIS — Z125 Encounter for screening for malignant neoplasm of prostate: Secondary | ICD-10-CM | POA: Diagnosis not present

## 2023-09-12 DIAGNOSIS — R7303 Prediabetes: Secondary | ICD-10-CM | POA: Diagnosis not present

## 2023-10-16 DIAGNOSIS — H524 Presbyopia: Secondary | ICD-10-CM | POA: Diagnosis not present

## 2023-10-16 DIAGNOSIS — E119 Type 2 diabetes mellitus without complications: Secondary | ICD-10-CM | POA: Diagnosis not present

## 2023-10-16 DIAGNOSIS — H5203 Hypermetropia, bilateral: Secondary | ICD-10-CM | POA: Diagnosis not present

## 2024-01-03 ENCOUNTER — Telehealth: Payer: Self-pay | Admitting: Pharmacy Technician

## 2024-01-03 ENCOUNTER — Other Ambulatory Visit (HOSPITAL_COMMUNITY): Payer: Self-pay

## 2024-01-03 NOTE — Telephone Encounter (Signed)
 Pharmacy Patient Advocate Encounter   Received notification from Fax that prior authorization for repatha  is required/requested.   Insurance verification completed.   The patient is insured through [facility] ADVANTAGE/RX ADVANCE.   Per test claim: PA required; PA submitted to above mentioned insurance via Latent Key/confirmation #/EOC [id number] Status is pending

## 2024-01-04 ENCOUNTER — Other Ambulatory Visit (HOSPITAL_COMMUNITY): Payer: Self-pay

## 2024-01-04 NOTE — Telephone Encounter (Signed)
 Pharmacy Patient Advocate Encounter  Received notification from [employer]/RX ADVANCE that Prior Authorization for Repatha  has been APPROVED from 01/03/24 to 01/03/25. Ran test claim, Copay is $117.50- 3 months. This test claim was processed through [facility]- copay amounts may vary at other pharmacies due to pharmacy/plan contracts, or as the patient moves through the different stages of their insurance plan.   PA #/Case ID/Reference #: [id number]

## 2024-01-07 ENCOUNTER — Ambulatory Visit: Payer: Self-pay | Admitting: Cardiovascular Disease

## 2024-01-18 NOTE — Progress Notes (Addendum)
 Luis Robles                                          MRN: [id number]   02/22/2024   The VBCI Quality Team Specialist reviewed this patient medical record for the purposes of chart review for care gap closure. The following were reviewed: chart review for care gap closure-kidney health evaluation for diabetes:eGFR  and uACR.    VBCI Quality Team
